# Patient Record
Sex: Female | Born: 1935 | Race: White | Hispanic: No | Smoking: Former smoker
Health system: Southern US, Community
[De-identification: ages and names within clinical notes are randomized; demographics above are authoritative.]

## PROBLEM LIST (undated history)

## (undated) DIAGNOSIS — M810 Age-related osteoporosis without current pathological fracture: Secondary | ICD-10-CM

## (undated) DIAGNOSIS — K219 Gastro-esophageal reflux disease without esophagitis: Secondary | ICD-10-CM

## (undated) DIAGNOSIS — E785 Hyperlipidemia, unspecified: Secondary | ICD-10-CM

## (undated) DIAGNOSIS — K5790 Diverticulosis of intestine, part unspecified, without perforation or abscess without bleeding: Secondary | ICD-10-CM

## (undated) DIAGNOSIS — Z8679 Personal history of other diseases of the circulatory system: Secondary | ICD-10-CM

## (undated) HISTORY — DX: Hyperlipidemia, unspecified: E78.5

## (undated) HISTORY — PX: BREAST BIOPSY: SHX20

## (undated) HISTORY — DX: Personal history of other diseases of the circulatory system: Z86.79

## (undated) HISTORY — DX: Age-related osteoporosis without current pathological fracture: M81.0

## (undated) HISTORY — DX: Gastro-esophageal reflux disease without esophagitis: K21.9

## (undated) HISTORY — DX: Diverticulosis of intestine, part unspecified, without perforation or abscess without bleeding: K57.90

---

## 1998-09-08 ENCOUNTER — Other Ambulatory Visit: Admission: RE | Admit: 1998-09-08 | Discharge: 1998-09-08 | Payer: Self-pay | Admitting: Endocrinology

## 1999-09-28 ENCOUNTER — Other Ambulatory Visit: Admission: RE | Admit: 1999-09-28 | Discharge: 1999-09-28 | Payer: Self-pay | Admitting: Endocrinology

## 2000-10-16 ENCOUNTER — Other Ambulatory Visit: Admission: RE | Admit: 2000-10-16 | Discharge: 2000-10-16 | Payer: Self-pay | Admitting: Endocrinology

## 2001-11-19 ENCOUNTER — Encounter: Admission: RE | Admit: 2001-11-19 | Discharge: 2001-11-19 | Payer: Self-pay | Admitting: Endocrinology

## 2001-11-19 ENCOUNTER — Encounter: Payer: Self-pay | Admitting: Endocrinology

## 2004-04-08 ENCOUNTER — Other Ambulatory Visit: Admission: RE | Admit: 2004-04-08 | Discharge: 2004-04-08 | Payer: Self-pay | Admitting: Obstetrics and Gynecology

## 2004-06-04 HISTORY — PX: TOTAL ABDOMINAL HYSTERECTOMY W/ BILATERAL SALPINGOOPHORECTOMY: SHX83

## 2004-06-15 ENCOUNTER — Inpatient Hospital Stay (HOSPITAL_COMMUNITY): Admission: RE | Admit: 2004-06-15 | Discharge: 2004-06-17 | Payer: Self-pay | Admitting: Obstetrics and Gynecology

## 2004-06-15 ENCOUNTER — Encounter (INDEPENDENT_AMBULATORY_CARE_PROVIDER_SITE_OTHER): Payer: Self-pay | Admitting: *Deleted

## 2004-06-30 ENCOUNTER — Ambulatory Visit: Admission: RE | Admit: 2004-06-30 | Discharge: 2004-06-30 | Payer: Self-pay | Admitting: Gynecology

## 2004-07-06 ENCOUNTER — Ambulatory Visit: Admission: RE | Admit: 2004-07-06 | Discharge: 2004-10-04 | Payer: Self-pay | Admitting: Radiation Oncology

## 2004-07-22 ENCOUNTER — Ambulatory Visit (HOSPITAL_COMMUNITY): Admission: RE | Admit: 2004-07-22 | Discharge: 2004-07-22 | Payer: Self-pay | Admitting: Radiation Oncology

## 2004-09-10 ENCOUNTER — Ambulatory Visit (HOSPITAL_COMMUNITY): Admission: RE | Admit: 2004-09-10 | Discharge: 2004-09-10 | Payer: Self-pay | Admitting: Gynecology

## 2004-09-17 ENCOUNTER — Ambulatory Visit (HOSPITAL_COMMUNITY): Admission: RE | Admit: 2004-09-17 | Discharge: 2004-09-17 | Payer: Self-pay | Admitting: Gynecology

## 2004-09-24 ENCOUNTER — Ambulatory Visit (HOSPITAL_COMMUNITY): Admission: RE | Admit: 2004-09-24 | Discharge: 2004-09-24 | Payer: Self-pay | Admitting: Radiation Oncology

## 2004-10-26 ENCOUNTER — Ambulatory Visit: Admission: RE | Admit: 2004-10-26 | Discharge: 2004-10-26 | Payer: Self-pay | Admitting: Radiation Oncology

## 2004-12-05 DIAGNOSIS — K5790 Diverticulosis of intestine, part unspecified, without perforation or abscess without bleeding: Secondary | ICD-10-CM

## 2004-12-05 HISTORY — DX: Diverticulosis of intestine, part unspecified, without perforation or abscess without bleeding: K57.90

## 2004-12-05 HISTORY — PX: COLONOSCOPY: SHX174

## 2004-12-09 ENCOUNTER — Ambulatory Visit: Payer: Self-pay | Admitting: Internal Medicine

## 2004-12-21 ENCOUNTER — Ambulatory Visit: Admission: RE | Admit: 2004-12-21 | Discharge: 2004-12-21 | Payer: Self-pay | Admitting: Gynecology

## 2004-12-21 ENCOUNTER — Other Ambulatory Visit: Admission: RE | Admit: 2004-12-21 | Discharge: 2004-12-21 | Payer: Self-pay | Admitting: Gynecology

## 2004-12-21 ENCOUNTER — Encounter (INDEPENDENT_AMBULATORY_CARE_PROVIDER_SITE_OTHER): Payer: Self-pay | Admitting: *Deleted

## 2004-12-23 ENCOUNTER — Ambulatory Visit: Payer: Self-pay | Admitting: Internal Medicine

## 2005-02-04 ENCOUNTER — Other Ambulatory Visit: Admission: RE | Admit: 2005-02-04 | Discharge: 2005-02-04 | Payer: Self-pay | Admitting: Gynecology

## 2005-02-04 ENCOUNTER — Encounter (INDEPENDENT_AMBULATORY_CARE_PROVIDER_SITE_OTHER): Payer: Self-pay | Admitting: Specialist

## 2005-02-04 ENCOUNTER — Ambulatory Visit: Admission: RE | Admit: 2005-02-04 | Discharge: 2005-02-04 | Payer: Self-pay | Admitting: Gynecology

## 2005-05-31 ENCOUNTER — Ambulatory Visit: Admission: RE | Admit: 2005-05-31 | Discharge: 2005-05-31 | Payer: Self-pay | Admitting: Radiation Oncology

## 2005-06-22 ENCOUNTER — Ambulatory Visit: Admission: RE | Admit: 2005-06-22 | Discharge: 2005-06-22 | Payer: Self-pay | Admitting: Radiation Oncology

## 2005-06-22 ENCOUNTER — Other Ambulatory Visit: Admission: RE | Admit: 2005-06-22 | Discharge: 2005-06-22 | Payer: Self-pay | Admitting: Radiation Oncology

## 2005-08-02 ENCOUNTER — Other Ambulatory Visit: Admission: RE | Admit: 2005-08-02 | Discharge: 2005-08-02 | Payer: Self-pay | Admitting: *Deleted

## 2005-11-08 ENCOUNTER — Ambulatory Visit: Admission: RE | Admit: 2005-11-08 | Discharge: 2005-11-08 | Payer: Self-pay | Admitting: Gynecologic Oncology

## 2005-11-08 ENCOUNTER — Other Ambulatory Visit: Admission: RE | Admit: 2005-11-08 | Discharge: 2005-11-08 | Payer: Self-pay | Admitting: Gynecologic Oncology

## 2005-11-08 ENCOUNTER — Encounter (INDEPENDENT_AMBULATORY_CARE_PROVIDER_SITE_OTHER): Payer: Self-pay | Admitting: Specialist

## 2005-12-21 IMAGING — RF DG C-ARM 61-120 MIN
1 series · 2 of 2 positions shown · non-contrast
Comparison: none

CLINICAL DATA: Uterine ca.
 TWO VIEWS OF THE PELVIS
 AP and lateral fluoroscopically obtained views of the pelvis show what appears to be a brachytherapy device in position suggesting that it may be within the uterus.  
 IMPRESSION
 Probable brachytherapy placement.

[Series 1: run · 2 of 2 slices shown]
[im 1/2]
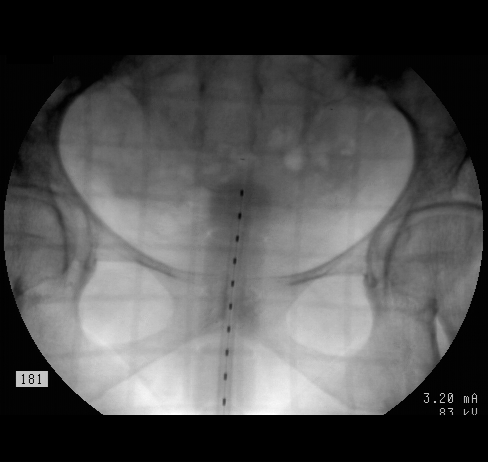
[im 2/2]
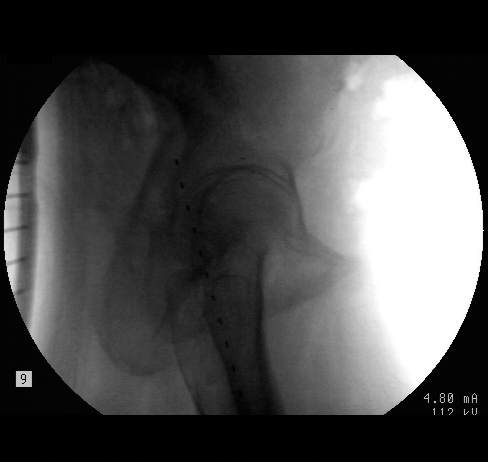

[2 of 2 positions shown; findings below may reference images not displayed]

## 2005-12-28 IMAGING — RF DG PELVIS 1-2V
1 series · 2 of 2 positions shown · non-contrast
Comparison: none

CLINICAL DATA: Cervical cancer/radiation therapy treatment.
 PELVIS 1 VIEW 
 A single AP electronic spot image centered over the symphysis pubis shows an electrode projecting over the midline.  The position is consistent with the vagina and/or cervix.

[Series 1: run · 2 of 2 slices shown]
[im 1/2]
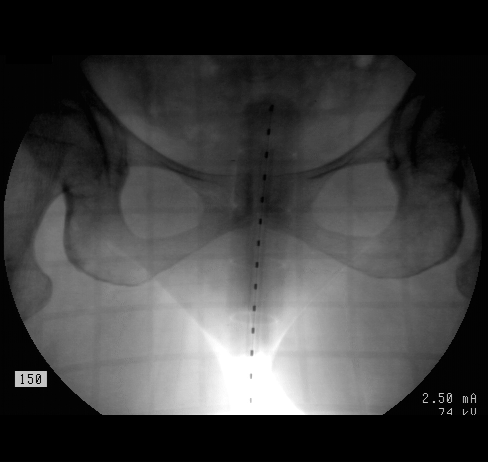
[im 2/2]
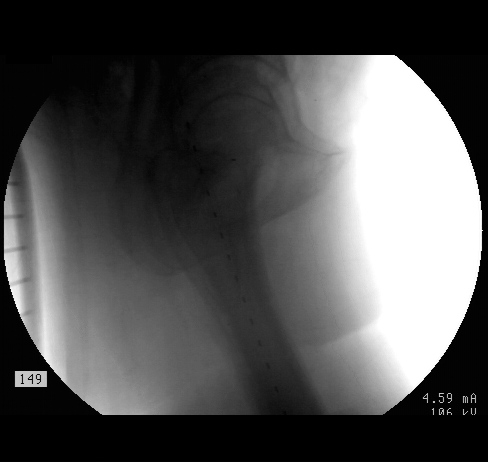

[2 of 2 positions shown; findings below may reference images not displayed]

IMPRESSION: Midline electrode ? see above.

## 2006-01-04 IMAGING — RF DG PELVIS 1-2V
1 series · 1 of 1 positions shown · non-contrast
Comparison: none

CLINICAL DATA: 72-year-old with cervical cancer. 
 ONE TO TWO VIEW PELVIS 
 Comparison to study on 09/16/04.  
 A single lateral view is performed of the pelvis and submitted.  Based on the prior exam, there is a presumed vaginal radiation device with radiopaque markers.  The position of this device does not appear to be changed on the lateral view when compared to the prior study.  Small amount of rectal gas is seen posterior to the markers.  
 IMPRESSION
 Radioactive implant device appears to be unchanged in position compared to the prior study, based on the lateral view only.

[Series 1: run · 1 of 1 slices shown]
[im 1/1]
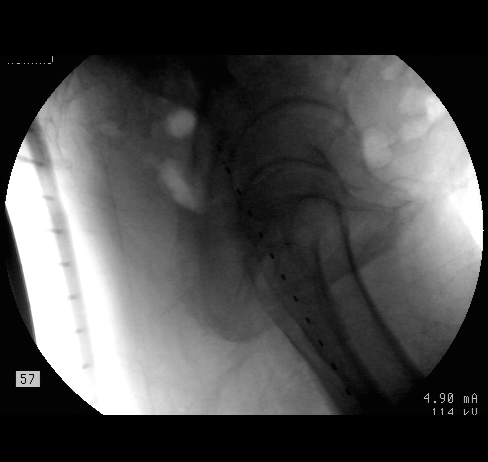

[1 of 1 positions shown; findings below may reference images not displayed]

## 2006-05-16 ENCOUNTER — Other Ambulatory Visit: Admission: RE | Admit: 2006-05-16 | Discharge: 2006-05-16 | Payer: Self-pay | Admitting: Radiation Oncology

## 2006-05-16 ENCOUNTER — Ambulatory Visit: Admission: RE | Admit: 2006-05-16 | Discharge: 2006-06-09 | Payer: Self-pay | Admitting: Radiation Oncology

## 2006-09-29 ENCOUNTER — Encounter: Admission: RE | Admit: 2006-09-29 | Discharge: 2006-09-29 | Payer: Self-pay | Admitting: Obstetrics & Gynecology

## 2006-11-15 ENCOUNTER — Other Ambulatory Visit: Admission: RE | Admit: 2006-11-15 | Discharge: 2006-11-15 | Payer: Self-pay | Admitting: Gynecology

## 2006-11-15 ENCOUNTER — Encounter (INDEPENDENT_AMBULATORY_CARE_PROVIDER_SITE_OTHER): Payer: Self-pay | Admitting: *Deleted

## 2006-11-15 ENCOUNTER — Ambulatory Visit: Admission: RE | Admit: 2006-11-15 | Discharge: 2006-11-15 | Payer: Self-pay | Admitting: Gynecology

## 2007-01-02 ENCOUNTER — Ambulatory Visit: Admission: RE | Admit: 2007-01-02 | Discharge: 2007-01-02 | Payer: Self-pay | Admitting: Gynecologic Oncology

## 2007-03-08 ENCOUNTER — Other Ambulatory Visit: Admission: RE | Admit: 2007-03-08 | Discharge: 2007-03-08 | Payer: Self-pay | Admitting: Obstetrics & Gynecology

## 2007-05-23 ENCOUNTER — Other Ambulatory Visit: Admission: RE | Admit: 2007-05-23 | Discharge: 2007-05-23 | Payer: Self-pay | Admitting: Radiation Oncology

## 2007-08-24 ENCOUNTER — Other Ambulatory Visit: Admission: RE | Admit: 2007-08-24 | Discharge: 2007-08-24 | Payer: Self-pay | Admitting: Gynecology

## 2007-08-24 ENCOUNTER — Ambulatory Visit: Admission: RE | Admit: 2007-08-24 | Discharge: 2007-08-24 | Payer: Self-pay | Admitting: Gynecology

## 2007-08-24 ENCOUNTER — Encounter: Payer: Self-pay | Admitting: Gynecology

## 2007-12-03 ENCOUNTER — Other Ambulatory Visit: Admission: RE | Admit: 2007-12-03 | Discharge: 2007-12-03 | Payer: Self-pay | Admitting: Obstetrics & Gynecology

## 2008-01-09 IMAGING — CT CT ABDOMEN W/ CM
1 of 6 series · 12 of 32 positions shown, 18 images · IV contrast (omnipaque)
Comparison: 07/22/04.

CLINICAL DATA: Endometrial carcinoma.   Lower abdominal and pelvic pain. 
ABDOMEN CT WITH CONTRAST:
TECHNIQUE: Multidetector CT imaging of the abdomen was performed following the standard protocol during bolus administration of intravenous contrast.
Contrast:  100 cc Omnipaque 300 and oral contrast.
TECHNIQUE: Multidetector CT imaging of the pelvis was performed following the standard protocol during bolus administration of intravenous contrast.

[Series 102: routine abdomen · axial · 0.70mm/px · z∈[-331,+2]mm · 12 of 314 slices shown, 18 images]
[im 25/314  soft-tissue]
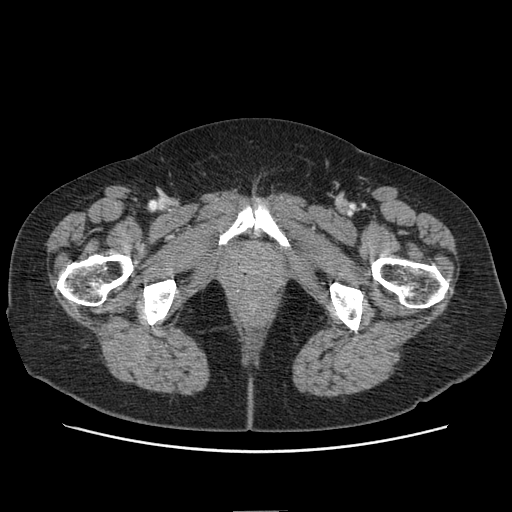
[im 25/314  bone]
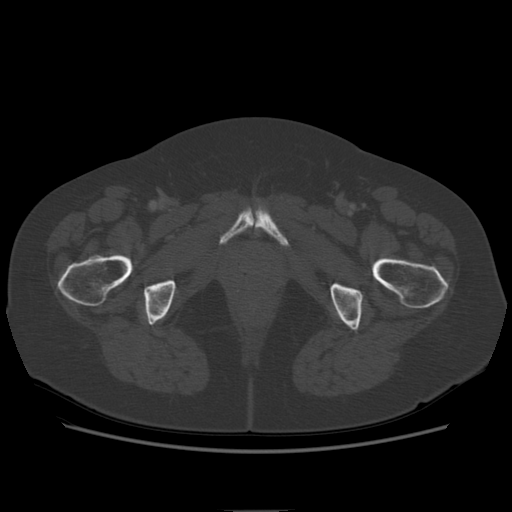
[im 49/314  soft-tissue]
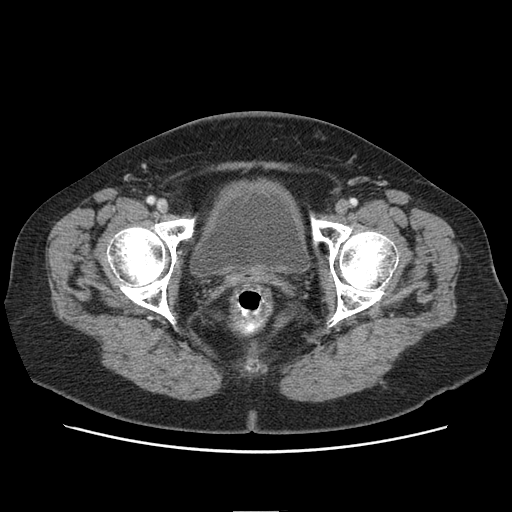
[im 73/314  soft-tissue]
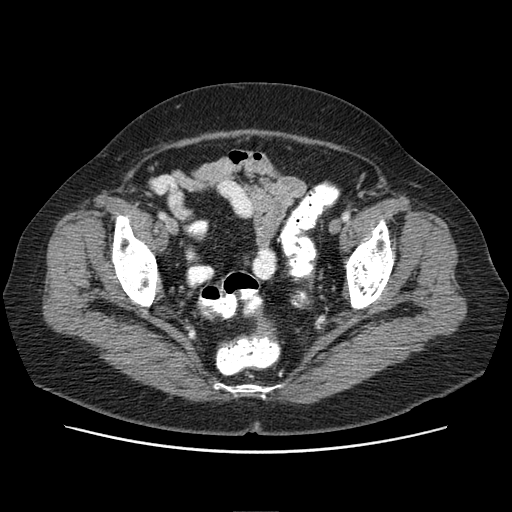
[im 97/314  soft-tissue]
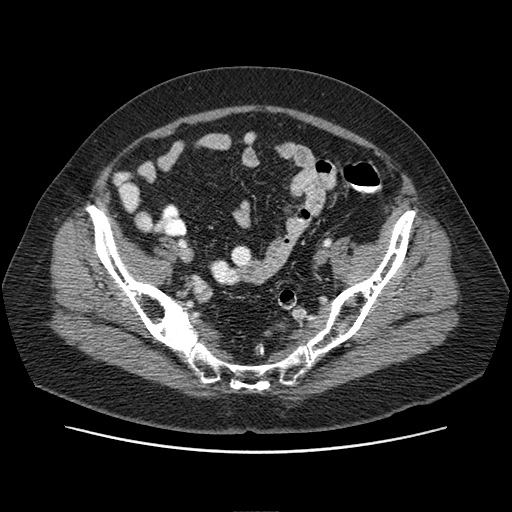
[im 121/314  soft-tissue]
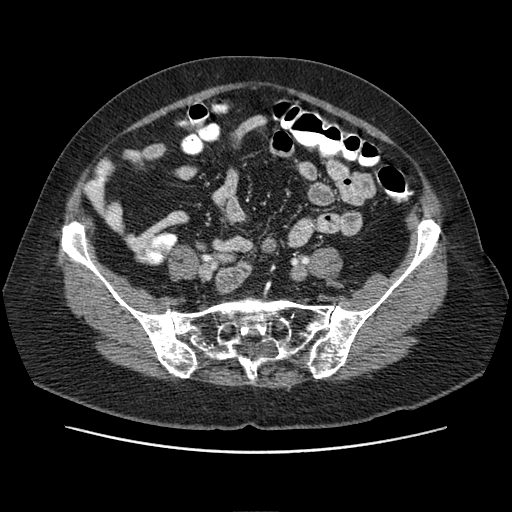
[im 145/314  soft-tissue]
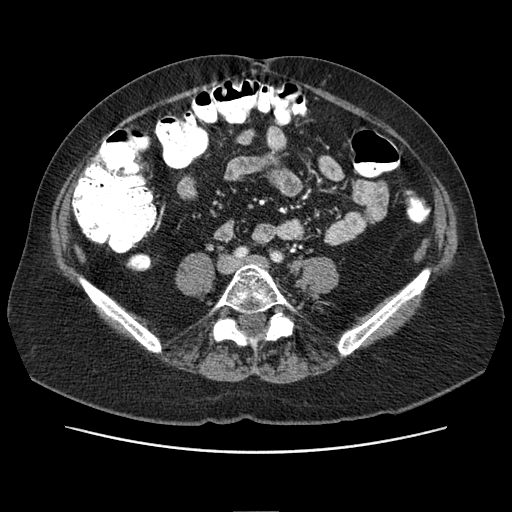
[im 169/314  soft-tissue]
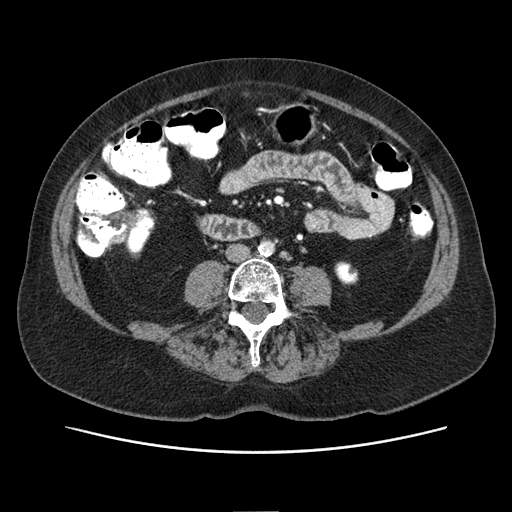
[im 193/314  soft-tissue]
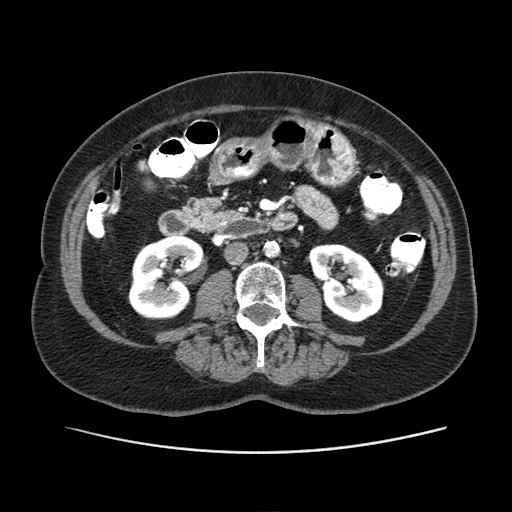
[im 217/314  soft-tissue]
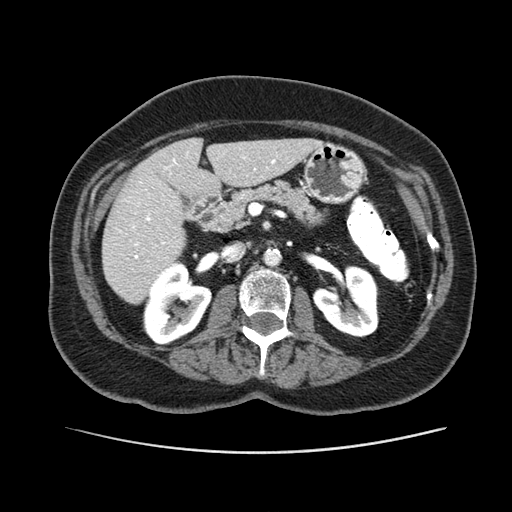
[im 217/314  lung]
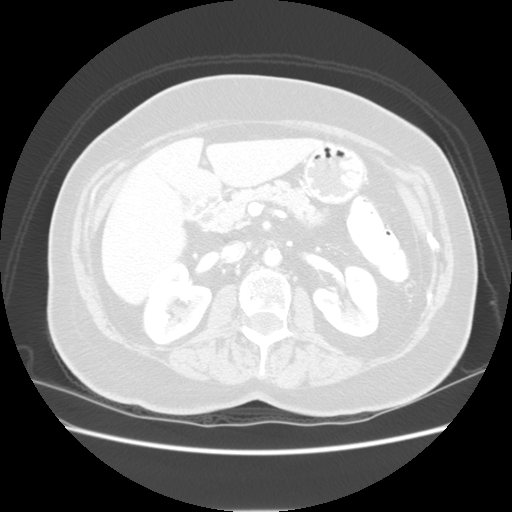
[im 217/314  bone]
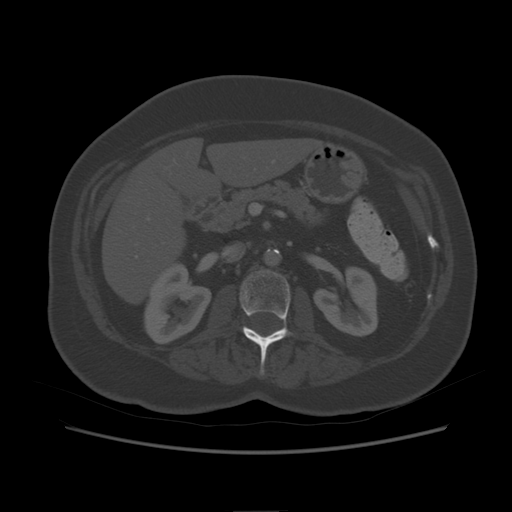
[im 241/314  soft-tissue]
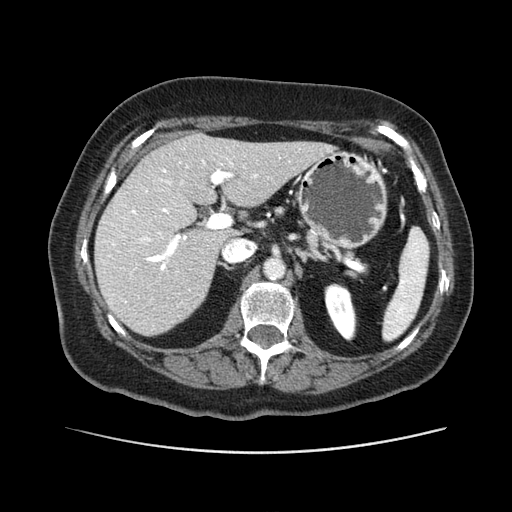
[im 241/314  lung]
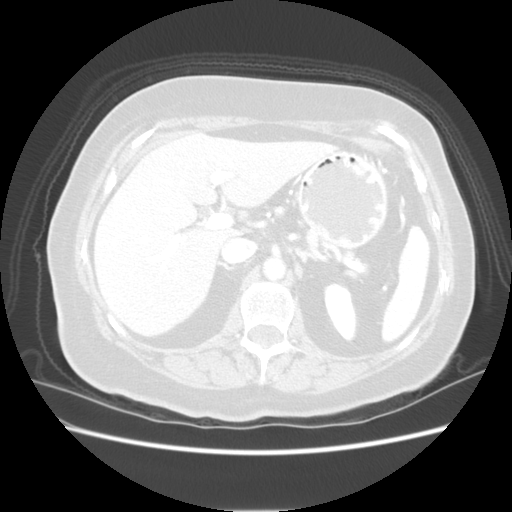
[im 265/314  soft-tissue]
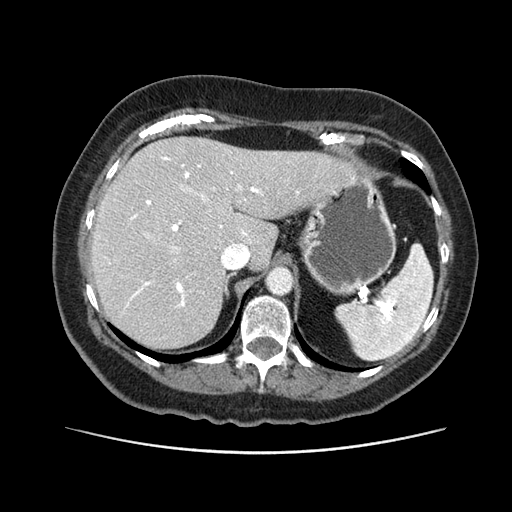
[im 265/314  lung]
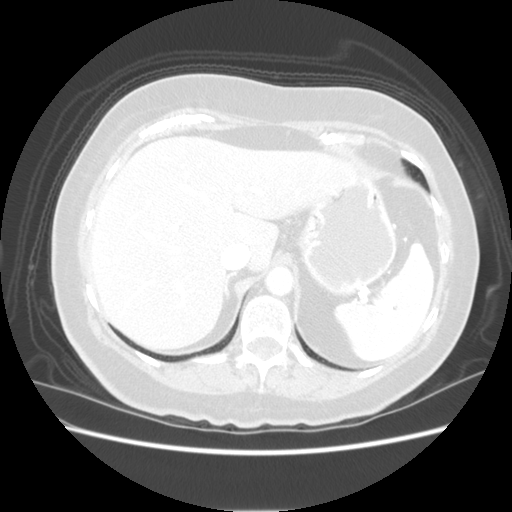
[im 289/314  soft-tissue]
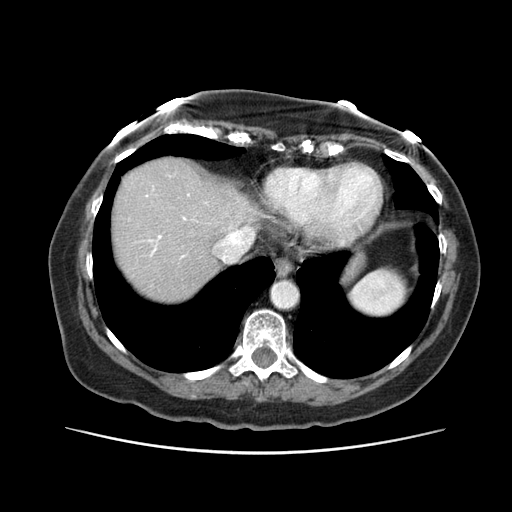
[im 289/314  lung]
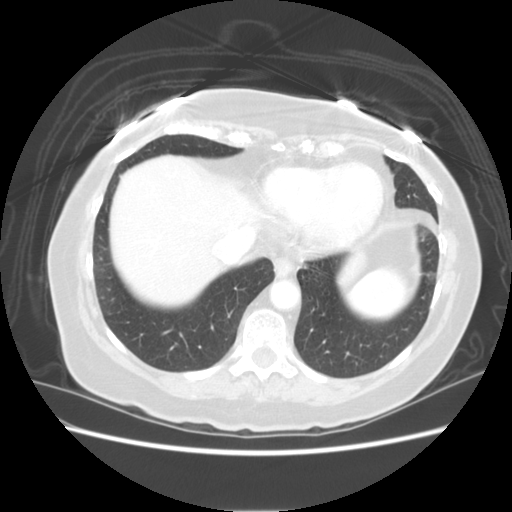

[12 of 32 positions shown; findings below may reference images not displayed]

FINDINGS: A tiny approximately 5 mm cyst in the dome of the liver is stable.  No liver masses are identified.  The gallbladder and appendix are normal in appearance.  The pancreas, spleen, adrenal glands, and kidneys are normal in appearance.  There is no evidence of mass or adenopathy within the abdomen.  There is no evidence of inflammatory process or ascites.  Abdominal bowel loops are unremarkable in appearance.
IMPRESSION: Stable abdomen CT.  No evidence of metastatic disease or other acute findings.
PELVIS CT WITH CONTRAST:
FINDINGS: The patient has undergone previous hysterectomy.  No mass or adenopathy is identified within the pelvis.  There is no evidence of inflammatory process or abnormal fluid collections.  Diverticulosis is again seen involving the sigmoid colon.  However, there is no evidence of diverticulitis.  No significant change is seen compared with the prior study.
IMPRESSION: 1.  No acute findings. 
2.  Sigmoid diverticulosis again noted.

## 2008-03-26 ENCOUNTER — Encounter: Payer: Self-pay | Admitting: Radiation Oncology

## 2008-03-26 ENCOUNTER — Other Ambulatory Visit: Admission: RE | Admit: 2008-03-26 | Discharge: 2008-03-26 | Payer: Self-pay | Admitting: Radiation Oncology

## 2008-03-26 ENCOUNTER — Ambulatory Visit: Admission: RE | Admit: 2008-03-26 | Discharge: 2008-03-26 | Payer: Self-pay | Admitting: Radiation Oncology

## 2008-06-13 ENCOUNTER — Encounter: Payer: Self-pay | Admitting: Gynecology

## 2008-06-13 ENCOUNTER — Other Ambulatory Visit: Admission: RE | Admit: 2008-06-13 | Discharge: 2008-06-13 | Payer: Self-pay | Admitting: Gynecology

## 2008-06-13 ENCOUNTER — Ambulatory Visit: Admission: RE | Admit: 2008-06-13 | Discharge: 2008-06-13 | Payer: Self-pay | Admitting: Gynecology

## 2008-10-09 ENCOUNTER — Other Ambulatory Visit: Admission: RE | Admit: 2008-10-09 | Discharge: 2008-10-09 | Payer: Self-pay | Admitting: Obstetrics & Gynecology

## 2009-03-24 ENCOUNTER — Other Ambulatory Visit: Admission: RE | Admit: 2009-03-24 | Discharge: 2009-03-24 | Payer: Self-pay | Admitting: Radiation Oncology

## 2009-03-24 ENCOUNTER — Encounter: Payer: Self-pay | Admitting: Radiation Oncology

## 2009-03-24 ENCOUNTER — Ambulatory Visit: Admission: RE | Admit: 2009-03-24 | Discharge: 2009-03-24 | Payer: Self-pay | Admitting: Radiation Oncology

## 2009-07-10 ENCOUNTER — Other Ambulatory Visit: Admission: RE | Admit: 2009-07-10 | Discharge: 2009-07-10 | Payer: Self-pay | Admitting: Gynecology

## 2009-07-10 ENCOUNTER — Encounter: Payer: Self-pay | Admitting: Gynecology

## 2009-07-10 ENCOUNTER — Ambulatory Visit: Admission: RE | Admit: 2009-07-10 | Discharge: 2009-07-10 | Payer: Self-pay | Admitting: Gynecology

## 2010-03-23 ENCOUNTER — Ambulatory Visit: Admission: RE | Admit: 2010-03-23 | Discharge: 2010-03-23 | Payer: Self-pay | Admitting: Radiation Oncology

## 2011-04-19 NOTE — Consult Note (Signed)
[name], Shelia NO.:  [id number]   MEDICAL RECORD NO.:  [id number]          PATIENT TYPE:  OUT   LOCATION:  GYN                          FACILITY:  [facility]   PHYSICIAN:  De Blanch, SheliaD.DATE OF BIRTH:  1932/11/03   DATE OF CONSULTATION:  08/24/2007  DATE OF DISCHARGE:                                 CONSULTATION   CHIEF COMPLAINT:  Endometrial cancer.   INTERVAL HISTORY:  Since her last visit, the patient has done well.  She  does have intermittent diarrhea which she is able to control with  Imodium and dietary modification.  Green, leafy vegetables seem to cause  more trouble with diarrhea than any other foods.  Otherwise, she has no  other GI or GU symptoms.  Her functional status is excellent.  She  denies any abdominal pain, pressure, vaginal bleeding, or discharge.   HISTORY OF PRESENT ILLNESS:  The patient underwent initial surgery in  July 2005 for a stage IIB grade 1 endometrial adenocarcinoma.  She  received postoperative pelvic radiation therapy with high dose vaginal  per vault brachytherapy.  She was then followed without any evidence of  disease, although she has had the occasional Pap smear showing atypical  glandular cell.  It was thought that this was possibly associated with  radiation therapy, and she has been using vaginal estrogen cream twice  weekly.   CURRENT MEDICATIONS:  Nexium, Premarin vaginal cream, atenolol, Vytorin,  vitamin D with fish oil.   DRUG ALLERGIES:  None.   PAST MEDICAL HISTORY:  1. Hypertension.  2. Hyperlipidemia.   FAMILY HISTORY:  Negative for gynecologic, breast, or colon cancers.   SOCIAL HISTORY:  The patient does not smoke.   REVIEW OF SYSTEMS:  Ten point comprehensive review of systems negative  except as noted above.   PHYSICAL EXAM:  Weight 139 pounds, blood pressure 128/76.  GENERAL:  The patient is a healthy white female in no acute distress.  HEENT:  Negative.  NECK:  Supple  without thyromegaly.  There is no supraclavicular or  inguinal adenopathy.  ABDOMEN:  Soft, nontender.  No masses, organomegaly, ascites, or hernias  are noted.  PELVIC EXAM:  EG/BUS, vagina, bladder, urethra are normal.  Cervix,  uterus are surgically absent.  No lesions are noted.  Bimanual and  rectovaginal exam reveal no masses, induration, or nodularity.  LOWER EXTREMITIES:  Without edema or varicosities.   IMPRESSION:  Stage IIB grade 1 endometrial carcinoma status post surgery  and pelvic radiation therapy.  No evidence of recurrent disease.   PLAN:  Pap smears are obtained today.  She will return to see Dr.  Leda [name] as previously scheduled in November.  I would suggest  that we rotate visits, and therefore she would see Dr. Roselind Messier in March  and gyn/oncology in July.  We will continue this rotation to complete 5  years of followup.      De Blanch, SheliaD.  Electronically Signed     DC/MEDQ  D:  08/24/2007  T:  08/24/2007  Job:  [id number]   cc:   Shelia Shelia, SheliaD.  Fax: [phone]   M. Leda Quail, MD  Fax: [phone]   Shelia Shelia, SheliaD.  Fax: [phone]   Shelia Bonito. Marina Goodell, MD  [address]  [place]  [place] [address]   Shelia Shelia, R.N.  [address]  [place], [place] [address]

## 2011-04-19 NOTE — Consult Note (Signed)
[name], Shelia NO.:  [id number]   MEDICAL RECORD NO.:  [id number]          PATIENT TYPE:  OUT   LOCATION:  GYN                          FACILITY:  [facility]   PHYSICIAN:  De Blanch, M.D.DATE OF BIRTH:  [birth year]   DATE OF CONSULTATION:  06/13/2008  DATE OF DISCHARGE:                                 CONSULTATION   CHIEF COMPLAINT:  Endometrial cancer.   INTERVAL HISTORY:  Since the patient's last visit with me, she has seen  Dr. Roselind Messier and Dr. Hyacinth Meeker.  Apparently she has done well.  She denies  any GI or GU symptoms, has no pelvic pain, pressure, vaginal bleeding or  discharge.  Functional status is excellent.  She uses Premarin cream  without any difficulty.   HISTORY OF PRESENT ILLNESS:  Patient underwent TAH/BSO, pelvic and  periaortic lymphadenectomy in July of 2005.  She was found to have a  stage II-B, grade 1 endometrial adenocarcinoma.  She received  postoperative pelvic radiation therapy and high dose rate vaginal vault  brachytherapy.  She had been followed since that time with no evidence  of recurrent disease, although she has had occasional Pap smears showing  atypical glandular cells.   CURRENT MEDICATIONS:  Nexium, Premarin vaginal cream, Atenolol, Vytorin,  vitamin D, and fish oil.   DRUG ALLERGIES:  None.   PAST MEDICAL HISTORY:  Hypertension, hyperlipidemia.   FAMILY HISTORY:  Negative for gynecologic, breast or colon cancer.   SOCIAL HISTORY:  The patient is married, does not smoke.   REVIEW OF SYSTEMS:  Ten-point comprehensive review of systems negative,  except as noted above.   PHYSICAL EXAM:  Weight 138 pounds, blood pressure 130/70, pulse 80,  respiratory rate 20.  GENERAL:  Patient is a healthy, white female, in no acute distress.  HEENT:  Negative.  NECK:  Supple without thyromegaly.  There is no supraclavicular or  inguinal adenopathy.  ABDOMEN:  Soft, nontender, no masses, organomegaly, ascites or hernias  noted.  PELVIC EXAM:  EG, BUS, vagina, bladder, urethra are normal.  Cervix and  uterus are surgically absent.  Adnexa without masses.  Rectovaginal exam  confirms.  LOWER EXTREMITIES:  Without edema or varicosities.   IMPRESSION:  Stage II-B, grade 1 endometrial carcinoma, status post  surgical resection and postoperative pelvic radiation therapy.  No  evidence of recurrent disease.   PLAN:  Pap smears were obtained.  The patient was given renewed  prescription for Premarin cream.  She will see Dr. Leanora Ivanoff in  four months, Dr. Roselind Messier four months after that and will see [place] in one  year.      De Blanch, M.D.  Electronically Signed     DC/MEDQ  D:  06/13/2008  T:  06/13/2008  Job:  [id number]   cc:   Billie Lade, M.D.  Fax: [phone]   Corlis Leak Evlyn Kanner, M.D.  Fax: [phone]   Wilhemina Bonito. Marina Goodell, MD  [address]  [place]  [place] [address]

## 2011-04-19 NOTE — Consult Note (Signed)
[name], [name] NO.:  [id number]   MEDICAL RECORD NO.:  [id number]          PATIENT TYPE:  OUT   LOCATION:  GYN                          FACILITY:  [facility]   PHYSICIAN:  De Blanch, M.D.DATE OF BIRTH:  09-08-1932   DATE OF CONSULTATION:  DATE OF DISCHARGE:                                 CONSULTATION   CHIEF COMPLAINT:  Endometrial cancer.   INTERVAL HISTORY:  The patient returns today for continuing follow-up  now 5 years since undergoing surgery for a stage IIB grade 1 endometrial  carcinoma.  Since her last visit she has done well.  She saw Dr.  Rondel Baton nurse practitioner approximately 6 months ago.  At that time  apparently she was told that she would not be able to have intercourse  because of radiation changes.  The patient does use Premarin cream and a  vaginal dilator.  She has no GI or GU symptoms.  Her functional status  is excellent.   HISTORY OF PRESENT ILLNESS:  Stage IIB grade 1 endometrial cancer  undergoing initial surgery July 2005.  She received postoperative whole  pelvis radiation therapy and a high dose rate vaginal vault  brachytherapy.   CURRENT MEDICATIONS:  Nexium, Premarin vaginal cream, atenolol, Vytorin,  vitamin D, and fish oil.   DRUG ALLERGIES:  None.   PAST MEDICAL HISTORY:  1. Hyperlipidemia.  2. Hypertension.   FAMILY HISTORY:  Negative for gynecologic, breast or colon cancer.   SOCIAL HISTORY:  The patient is married.  Does not smoke.   REVIEW OF SYSTEMS:  A 10-point comprehensive review of systems negative  except as noted above.   PHYSICAL EXAMINATION:  Weight 135 pounds, blood pressure 122/60.  GENERAL:  The patient is a healthy white female in no acute distress.  HEENT:  Negative.  NECK:  Supple without thyromegaly.  There is no supraclavicular or  inguinal adenopathy.  ABDOMEN:  Soft, nontender.  No mass, organomegaly, ascites or hernias  noted.  PELVIC:  EGBUS is normal.  The vagina has  normal caliber, but is  foreshortened.  Pap smears were obtained.  Bimanual and rectovaginal  exam revealed no masses, induration or nodularity.   IMPRESSION:  Stage IIB grade 1 endometrial cancer now with 5 years of  follow-up clinically free of disease.   PLAN:  Pap smears are obtained.  The patient will return to the care of  Dr. Hyacinth Meeker.  I would recommend that she have exams on an annual basis.  We would be happy to see her in the future if she had any difficulty.      De Blanch, M.D.  Electronically Signed     DC/MEDQ  D:  07/10/2009  T:  07/10/2009  Job:  [id number]   cc:   Telford Nab, R.N.  [address]  [place], [place] [address]   M. Leda Quail, MD  Fax: [phone]   Tera Mater. Evlyn Kanner, M.D.  Fax: [phone]   Wilhemina Bonito. Marina Goodell, MD  [address]  [place]  [place] [address]

## 2011-04-22 NOTE — Consult Note (Signed)
[name], APT NO.:  [id number]   MEDICAL RECORD NO.:  [id number]          PATIENT TYPE:  OUT   LOCATION:  GYN                          FACILITY:  [facility]   PHYSICIAN:  De Blanch, M.D.DATE OF BIRTH:  [birth year]   DATE OF CONSULTATION:  DATE OF DISCHARGE:                                 CONSULTATION   CHIEF COMPLAINT:  Endometrial cancer.   INTERVAL HISTORY:  Since her last visit, the patient has seen Dr.  Hyacinth Meeker.  Since then, she has had no gynecologic symptoms.  She  specifically denies any GI or GU symptoms, has no pelvic pain, pressure,  vaginal bleeding, or discharge.  She continues to use Premarin vaginal  cream and has not had any vaginal bleeding.   HISTORY OF PRESENT ILLNESS:  Stage IIB, grade I endometrial  adenocarcinoma, undergoing initial surgical staging in July, 2005.  She  received postoperative pelvic radiation therapy and high-dose rate  brachytherapy.  She has been followed since that time with the only  complaint of vaginal atrophy.   PAST MEDICAL HISTORY:  Medical illnesses none.   PAST SURGICAL HISTORY:  TAH/BSO, bilateral tubal ligation, D&C, breast  biopsy.   CURRENT MEDICATIONS:  Niaspan.   OBSTETRICAL HISTORY:  Gravida 3.   FAMILY HISTORY:  Positive for colon cancer in a brother.   REVIEW OF SYSTEMS:  Negative except as noted above.  The patient  recently had a CT scan of the abdomen and pelvis because of lower  abdominal discomfort, which revealed only diverticulosis.   PHYSICAL EXAMINATION:  VITAL SIGNS:  Weight 144 pounds.  Blood pressure  130/76, pulse 80, respiratory rate 20.  GENERAL:  The patient is a healthy white female in no acute distress.  HEENT:  Negative.  NECK:  Supple without thyromegaly.  There is no supraclavicular or  inguinal adenopathy.  ABDOMEN:  Soft and nontender.  No mass, organomegaly, ascites, or  hernias are noted.  PELVIC:  EG/BUS, vagina, bladder, and urethra are normal except  for some  atrophic changes at the apex of the vagina.  Bimanual and rectovaginal  exam reveals no masses, induration, or nodularity.  EXTREMITIES:  Lower extremities are without edema or varicosities.   IMPRESSION:  Stage IIB, grade I endometrial carcinoma, no evidence of  recurrent disease.   PLAN:  Pap smears are obtained.  The patient returned to see Dr. Hyacinth Meeker  in six months.  Return to see [place] in one year.      De Blanch, M.D.  Electronically Signed     DC/MEDQ  D:  11/15/2006  T:  11/15/2006  Job:  [id number]   cc:   M. Leda Quail, MD  Fax: [phone]   Tera Mater. Evlyn Kanner, M.D.  Fax: [phone]   Wilhemina Bonito. Marina Goodell, MD  [address]  [place]  [place] [address]   Billie Lade, M.D.  Fax: [phone]   Telford Nab, R.N.  [address]  [place], [place] [address]

## 2011-04-22 NOTE — Discharge Summary (Signed)
NAME:  [name], [name]                        ACCOUNT NO.:  [id number]   MEDICAL RECORD NO.:  [id number]                   PATIENT TYPE:  INP   LOCATION:  0446                                 FACILITY:  [facility]   PHYSICIAN:  Laqueta Linden, M.D.                 DATE OF BIRTH:  [birth year]   DATE OF ADMISSION:  06/15/2004  DATE OF DISCHARGE:  06/17/2004                                 DISCHARGE SUMMARY   PRINCIPAL DIAGNOSIS ON DISCHARGE:  Grade 1 stage 1 adenocarcinoma of the  endometrium.   SECONDARY DIAGNOSES:  1. IHSS.  2. Hypercholesterolemia.   PROCEDURES:  Total abdominal hysterectomy with bilateral salpingo  oophorectomy, cytologic  washings, intraoperative frozen section.   COMPLICATIONS:  None.   TRANSFUSIONS:  None.   CONSULTATIONS:  None.   HISTORY OF PRESENT ILLNESS:  [name] is a 77 year old menopausal  female who presented with post menopausal bleeding and had an office  endometrial biopsy revealing a grade 1 endometrial adenocarcinoma.  She was  scheduled to undergo TAH BSO with GYN-ONC standby by Dr. Katheren Shams-  Sharol Given at [facility]  Please see written history and physical  for full details of her admission, past history, social history, family  history and examination.   HOSPITAL COURSE:  The patient was admitted for same-day surgery on June 15, 2004, having undergone a mechanical bowel prep at home.  Due to her history  of IHSS she was treated with ampicillin and gentamycin perioperatively.  She  was cleared for surgery perioperatively by Dr. [place] who felt like her IHSS  was stable on her beta blocker.   Intraoperatively, frozen section revealed less than 1 mm invasion of a well  differentiated adenocarcinoma with a 2 cm thick uterine wall.  This was  discussed briefly by telephone by Dr. Stanford Breed who was on standby who  concurred that further staging with lymphadenectomy was not indicated.  The  procedure took place without  complications.   Postoperatively, the patient did well.  On postoperative day 1 she had a  minimal amount of pain, was voiding and passing flatus.  She retained stable  vital signs with a regular pulse of 72.  Urine output was excellent.  She  began ambulating and advanced her diet on postoperative day 1.  Postoperative hemoglobin was 12.2, hematocrit 34.8, down from 13.1 and 38.5  preoperatively.  Her BMET was normal, with a slightly decreased sodium of  131.   On postoperative day 2, she was tolerating a general diet, passing flatus  and had a small stool and stated that she was ready to go home.  Her  incision was healing quite well and the staples were removed and steri-  strips were placed prior to discharge.  The final pathology was pending at  the time of discharge.   The patient was given routine written and verbal discharge instructions.  She was given  a prescription for Percocet 5/325, dispense 20 with no refills  to take 1-2 4-6 hours p.r.n. pain.  She was also to take her Advil or Aleve  as needed and all her routine medications.  She is to follow up in the  office in 2 weeks for an incision check and was told she was to receive a  phone call with her final pathology report and any additional  recommendations.  She was discharged home in improved condition.   LATE ADDENDUM:  Final pathology returned confirming a well-differentiated  endometrioid adenocarcinoma with less than 1/2 thickness myometrial  invasion.  However, the carcinoma did extend to involve endocervical glands  with cervical stromal invasion.  Vascular lymphatic invasion was not  identified and there was no other carcinoma identified on either tube or  ovaries.  Cytologic washings were negative.  Notably, this changes to Stage  IIB due to the cervical stromal invasion.  Consultation with consideration  of additional treatment has been arranged with Dr. Stanford Breed  postoperatively after discharge.                                                Laqueta Linden, M.D.    LKS/MEDQ  D:  07/30/2004  T:  07/30/2004  Job:  [id number]   cc:   Jeannett Senior A. Evlyn Kanner, M.D.  [address]  [place]  [place] [address]  Fax: [phone]   Peter M. Swaziland, M.D.  [address]  [place], [place] [address]  Fax: [phone]   Telford Nab, R.N.  [phone] [address]  [place], [place] [address]

## 2011-04-22 NOTE — Op Note (Signed)
NAME:  [name], [name]                        ACCOUNT NO.:  [id number]   MEDICAL RECORD NO.:  [id number]                   PATIENT TYPE:  INP   LOCATION:  0007                                 FACILITY:  [facility]   PHYSICIAN:  Laqueta Linden, M.D.                 DATE OF BIRTH:  01-Nov-1932   DATE OF PROCEDURE:  06/15/2004  DATE OF DISCHARGE:                                 OPERATIVE REPORT   PREOPERATIVE DIAGNOSIS:  Grade 1 adenocarcinoma of the endometrium.   POSTOPERATIVE DIAGNOSES:  Grade 1 adenocarcinoma of the endometrium, frozen  section with less than 1 mm of invasion of a well-differentiated tumor with  a total wall thickness of 2 cm per Dr. Quentin Ore, pathology, frozen section.   PROCEDURE:  Total abdominal hysterectomy, bilateral salpingo-oophorectomy,  cytologic washings.   SURGEON:  Laqueta Linden, M.D.   ASSISTANT:  Andres Ege, M.D.   ANESTHESIA:  General endotracheal.   ESTIMATED BLOOD LOSS:  Less than 100 mL.   URINE OUTPUT:  300 mL.   FLUIDS:  1700 mL of crystalloid (since 6 hours preoperatively).   SPECIMENS:  Uterus, tubes, ovaries and cytologic washings.   COUNTS:  Correct x2.   COMPLICATIONS:  None.   INDICATIONS:  [name] is a 77 year old menopausal female with  postmenopausal bleeding who underwent office endometrial biopsy revealing  grade 1 endometrioid adenocarcinoma.  She is to undergo TAH/BSO with  oncologic staging as necessary.  Dr. De Blanch has been  consulted and will be on surgical standby in the event that lymphadenectomy  and additional staging procedures are necessary.  The patient and her  husband have seen the informed consent film, voice understanding and  acceptance of all risks, benefits, alternatives and complications including,  but not limited to, anesthesia risk, infection, bleeding, possibly requiring  transfusion, injury to bowel, bladder, ureters, vessels, nerves, possibility  of fistula formation,  other postoperative risks including DVT, PE,  pneumonia, death as well as the potential increased risk of bleeding and  postoperative complications if the additional staging procedure is necessary  as well as the possible need for adjuvant therapy postoperatively.  She has  voiced her understanding and acceptance of all risks and agrees to proceed.   PROCEDURE:  The patient was taken to the operating room after having  undergone a mechanical bowel prep 24 hours prior to surgery and having been  on a clear liquid diet in the event that surgical staging was necessary.  She was admitted six hours preoperatively to start IV fluids so that she did  not enter the operating room dehydrated.  She was placed on the operating  table and after proper identification and consents were ascertained she was  then placed in the Anthony stirrups.  The abdomen, perineum and vagina were  then prepped and draped in a routine sterile fashion.  A transurethral Foley  was placed.  A low transverse  incision was made and carried down to the  level of the anterior rectus fascia.  The fascia was incised, rectus muscles  were separated, parietal peritoneum was incised.  The incision was extended  through the parietal peritoneum.  Cytologic washings were taken and sent in  heparinized solution to pathology.  Palpation of the upper abdomen revealed  smooth renal contours bilaterally, smooth peritoneal surfaces, smooth liver  edge and spleen tip.  No palpable gallstones.  The appendix was noted to be  quite long but within normal limits.  A self-retaining retractor was placed  and the bowel packed in the upper abdomen using moistened lap pads.  The  uterus was noted to be tiny.  Both tubes and ovaries were normal and  atrophic in appearance, both ureters were of normal course and caliber deep  in the pelvis and well out of the operative field.  The uterus was elevated  in the operative field.  The round ligaments were  clamped, cut, suture  ligated and tagged with 0 Vicryl suture.  The broad ligaments were then  opened anteriorly and posteriorly with dissection of the bladder off the  lower uterine segment and cervix.  The infundibulopelvic ligaments were  isolated bilaterally, clamped, cut and doubly ligated with a free tie and a  stitch of 0 Vicryl.  Uterine vessels were then skeletonized and clamped and  pedicles cut, suture ligated.  Curved Heaney clamps were then placed across  the cardinal ligaments bilaterally with the initial pedicle incorporating  the previous one, such that the uterine vessels were doubly ligated.  Curved  Heaney clamps were placed across the uterosacral ligaments and upper vaginal  angles bilaterally with excision of the specimen which was sent for frozen  section.  The upper vaginal angles were then closed with figure-of-eight  sutures of 0 Vicryl.  The remainder of the vaginal cuff was closed from  front to back using figure-of-eight sutures of 0 Vicryl as well.  Hemostasis  was noted to be excellent.  All pedicles were hemostatic, there was no  bleeding from any peritoneal surface.  The ureters were intact.  While  awaiting the frozen section, as it was felt very likely that further  surgical staging would not be necessary, closure of the incision was begun.  Copious lavage was performed.  The wet packs and retractor were then  removed.  All counts were correct prior to closure of the peritoneum.  The  peritoneum was then closed with 2-0 Vicryl, the rectus muscles loosely  reapproximated in midline.  Subfascial hemostasis was ascertained and the  fascia was closed from both lateral aspects to the midline using a running  stitch of 0 Maxon.  Subcutaneous hemostasis was obtained.  The skin was  closed with staples and Steri-Strips and a pressure dressing were applied.  The patient was kept asleep until the frozen section report was received, as noted above.  At this point, the  patient was awakened, extubated and stable  on transfer to the PACU.  Dr. Stanford Breed was immediately notified that  further surgical staging was not indicated and was taken off standby at 1515  hours.                                               Laqueta Linden, M.D.    LKS/MEDQ  D:  06/15/2004  T:  06/15/2004  Job:  [id number]   cc:   Tera Mater. Evlyn Kanner, M.D.  [address]  [place]  [place] [address]  Fax: [phone]   Peter M. Swaziland, M.D.  [address]  [place], [place] [address]  Fax: [phone]

## 2011-04-22 NOTE — Consult Note (Signed)
NAMEMERARI, PION NO.:  [id number]   MEDICAL RECORD NO.:  [id number]          PATIENT TYPE:  OUT   LOCATION:  GYN                          FACILITY:  [facility]   PHYSICIAN:  John T. Kyla Balzarine, M.D.    DATE OF BIRTH:  [birth year]   DATE OF CONSULTATION:  DATE OF DISCHARGE:                                 CONSULTATION   CHIEF COMPLAINT:  Followup of AGUS cytology in the setting of prior  stage 2B endometrial adenocarcinoma treated with surgery and  radiotherapy.   HISTORY OF PRESENT ILLNESS:  This patient had stage 2B, grade 1  endometrial adenocarcinoma treated with surgical staging in July 2005  followed by postoperative pelvic radiation and high-dose brachytherapy.  Last Pap smear was AGUS and it was thought that this might be  reactive/metaplastic.  She was seen by Dr. Leda Quail on January 7  and colposcopy revealed diminished Lugol's staining and a small area in  the right side wall with increased vascularity.  Biopsies from both  locations were obtained and returned negative for atypical glandular  cells, dysplasia, or malignancy.  There was slight inflammation and  fibrosis under the right cuff biopsy.  The patient is essentially  asymptomatic in regards to the pelvis, with no bleeding.  She is using  vaginal estrogen for a longstanding atrophy.  She is otherwise healthy  with no major comorbidities.  In addition to vaginal estrogen is using  Niaspan.   EXAM:  VITAL SIGNS:  Stable and afebrile as noted on the clinic flow  sheet.  The patient has a benign abdomen with no ascites, mass, or tenderness.  No hernias at the surgical sites.  There is no pedal edema, cords, or Homan's.  Lymph node survey is negative for pathological lymphadenopathy.  PELVIC:  External genitalia and BUS normal.  The vagina is extremely  atrophic with healing biopsy sites and no mucosal lesions.  Bimanual and  rectovaginal examinations reveal absent uterus and cervix with no  tenderness, mass, or nodularity.   ASSESSMENT:  1. Endometrial carcinoma, no evidence of disease.  2. Marked vaginal atrophy and response to pelvic radiation and age may      be the root cause of her atypical glandular cells.   PLAN:  The patient has previously been discussed with Dr. Hyacinth Meeker, and I  agree with her recommendation for followup cytology in 3 months.  The  patient was counseled to continue using her vaginal estrogen, and we  will see her 6 months after her followup with Dr. Hyacinth Meeker.      John T. Kyla Balzarine, M.D.  Electronically Signed     JTS/MEDQ  D:  01/02/2007  T:  01/02/2007  Job:  [id number]   cc:   M. Leda Quail, MD  Fax: [phone]   Tera Mater. Evlyn Kanner, M.D.  Fax: [phone]   Wilhemina Bonito. Marina Goodell, MD  [address]  [place]  [place] [address]   Billie Lade, M.D.  Fax: [phone]   Telford Nab, R.N.  [address]  [place], [place] [address]

## 2011-04-22 NOTE — Consult Note (Signed)
NAME:  [name], [name]                        ACCOUNT NO.:  [id number]   MEDICAL RECORD NO.:  [id number]                   PATIENT TYPE:  OUT   LOCATION:  GYN                                  FACILITY:  [facility]   PHYSICIAN:  De Blanch, M.D.         DATE OF BIRTH:  05-03-1932   DATE OF CONSULTATION:  06/30/2004  DATE OF DISCHARGE:                                   CONSULTATION   REASON FOR CONSULTATION:  A 77 year old white female seen in consultation at  the request of Dr. Laqueta Linden regarding management of endometrial cancer.   The patient was found to have a grade I endometrial cancer after being  evaluated for abnormal uterine bleeding.  She underwent total abdominal  hysterectomy and bilateral salpingo-oophorectomy on June 15, 2004.  Intraoperative frozen section was obtained revealing minimal myometrial  invasion.  The patient had an uncomplicated postoperative course.   Subsequently, the pathology has returned showing that in fact the  endometrial tumor is only invading 2 mm,  however, there is involvement of  the cervical stroma (stage IIB).  Perineal washings and adnexa were  negative.   PAST MEDICAL HISTORY:  None.   PAST SURGICAL HISTORY:  1. Total abdominal hysterectomy.  2. Bilateral salpingo-oophorectomy.  3. Bilateral tubal ligation.  4. D&C.  5. Breast biopsy.   OBSTETRICAL HISTORY:  Gravida 3.   CURRENT MEDICATIONS:  1. Atenolol.  2. Nexium.  3. Calcium.  4. Vitamin E.  5. Baby aspirin.   FAMILY HISTORY:  Positive for colon cancer in a brother.  There is no  ovarian, gynecologic, or breast cancer.   PHYSICAL EXAMINATION:  Weight 133 pounds, height 5 feet 2 inches, blood  pressure 140/82.  The remainder of the physical examination is deferred  because of the patient's recent surgery.   IMPRESSION:  Stage IIB grade 1 endometrial adenocarcinoma.   RECOMMENDATIONS:  I indicated to the patient that her risk of lymph node  metastases,  especially because of cervical stromal invasion, was elevated  and I would therefore recommend that she receive postoperative whole pelvis  radiation therapy.  The rationale for this approach was discussed at length  with the patient, her daughter, and husband.  They understand the value of  radiation therapy and potential side effects and complications.  We will  arrange for her to see Dr. Antony Blackbird on July 07, 2004, and proceed  thereafter with adjuvant treatment.  She will then return to the care of Dr.  Myrlene Broker, and we would recommend that she have exams every three months in  the first year, every four months in the second year, and every six months  to complete five years of followup.  De Blanch, M.D.    DC/MEDQ  D:  06/30/2004  T:  06/30/2004  Job:  [id number]   cc:   Billie Lade, M.D.  [address] - [facility]  [place]  [place] [address]  Fax: [phone]   Laqueta Linden, M.D.  [address]  [place]  [place] [address]  Fax: [phone]   Telford Nab, R.N.  [phone] [address]  [place], [place] [address]

## 2011-04-22 NOTE — Consult Note (Signed)
NAMEKHAILA, VELARDE NO.:  [id number]   MEDICAL RECORD NO.:  [id number]          PATIENT TYPE:  OUT   LOCATION:  GYN                          FACILITY:  [facility]   PHYSICIAN:  De Blanch, M.D.DATE OF BIRTH:  [birth year]   DATE OF CONSULTATION:  12/21/2004  DATE OF DISCHARGE:                                   CONSULTATION   A 77 year old white female returns for continuing follow-up of a stage IIB  endometrial cancer.  The patient underwent initial TAH/BSO, July 2005.  Final pathology showed there was involvement of the cervical stroma (stage  IIB).  She received postoperative pelvic radiation therapy and has done  well.  Currently, her only complaint is that she has some loose stools, but  she does not really have chronic diarrhea.  She has not altered her diet at  all.  Otherwise, she has no pelvic pain, pressure, vaginal pain or  discharge.  Her functional status is excellent.   PAST MEDICAL HISTORY:   MEDICAL ILLNESSES:  None.   PAST SURGICAL HISTORY:  1.  Total abdominal hysterectomy, bilateral salpingo-oophorectomy.  2.  Bilateral tubal ligation.  3.  D&C.  4.  Breast biopsy.   OBSTETRICAL HISTORY:  Gravida 3.   CURRENT MEDICATIONS:  Atenolol, Nexium, calcium, vitamin E, baby aspirin,  and Vytorin.   FAMILY HISTORY:  Positive for colon cancer in a brother.  There is no  ovarian, gynecologic, or breast cancer in the family history.   REVIEW OF SYSTEMS:  Negative except as noted above.   PHYSICAL EXAMINATION:  VITAL SIGNS:  Weight 131 pounds, blood pressure  128/80.  GENERAL:  The patient is a healthy white female in no acute distress.  HEENT:  Negative.  NECK:  Supple without thyromegaly.  There is no supraclavicular or inguinal  adenopathy.  ABDOMEN:  Soft, nontender.  No masses, organomegaly, ascites, or hernias are  noted.  Her incision is well-healed.  PELVIC:  EG/BUS, vagina, bladder, urethra are normal.  Vaginal cuff is well-  supported and well-healed, and no lesions are noted.  Bimanual and  rectovaginal exam reveal no masses, induration, or nodularity.   IMPRESSION:  Stage IIB grade 1 endometrial adenocarcinoma, status post  TAH/BSO and postoperative whole pelvis radiation therapy.  No evidence of  recurrent disease.   PLAN:  Pap smears are obtained.  The patient will return to see Dr. Larita Fife  Smith's associate in March.  Return to see Dr. Roselind Messier in June and return to  see me in September 2006.      DC/MEDQ  D:  12/21/2004  T:  12/21/2004  Job:  [id number]   cc:   Billie Lade, M.D.  [address] - [facility]  [place]  [place] [address]  Fax: [phone]   Laqueta Linden, M.D.  [address]  [place]  [place] [address]  Fax: [phone]   Telford Nab, R.N.  [phone] [address]  [place], [place] [address]

## 2011-04-22 NOTE — Consult Note (Signed)
[name], [name] NO.:  [id number]   MEDICAL RECORD NO.:  [id number]          PATIENT TYPE:  OUT   LOCATION:  GYN                          FACILITY:  [facility]   PHYSICIAN:  De Blanch, M.D.DATE OF BIRTH:  1932/08/18   DATE OF CONSULTATION:  DATE OF DISCHARGE:                                   CONSULTATION   A 77 year old white female returns for continuing followup of a stage IIB  endometrial carcinoma (grade I), status post initial surgery in July, 2005.  Because of cervical stromal involvement, she received postoperative pelvic  radiation therapy and three high-dose brachii therapy vaginal vault  applications.  The patient called earlier today, indicating that she had new  onset of pink vaginal discharge.  She denies any abdominal pain or pressure,  any other GI or GU symptoms.   MEDICAL ILLNESSES:  None.   PAST SURGICAL HISTORY:  TAH/BSO, bilateral tubal ligation, D&C, breast  biopsy.   CURRENT MEDICATIONS:  Niaspan.   OBSTETRICAL HISTORY:  Gravida 3.   FAMILY HISTORY:  Positive for colon cancer in a brother.   REVIEW OF SYSTEMS:  Negative except as noted above.   PHYSICAL EXAMINATION:  VITAL SIGNS:  Weight 137 pounds.  Blood pressure  160/84.  GENERAL:  The patient is a healthy white female in no acute distress.  HEENT:  Negative.  NECK:  Supple without thyromegaly.  There is no supraclavicular or inguinal  adenopathy.  ABDOMEN:  Soft and nontender.  No mass, organomegaly, ascites, or hernias  are noted.  PELVIC:  EG/BUS, vagina, bladder, and urethra are normal.  The vaginal is  atrophic, and there is a slight area of erythema and minimal spotting at the  vaginal apex.  This is consistent with atrophic vaginitis and possible  radiation effect.  Bimanual and rectovaginal exam reveals no mass,  induration, or nodularity.   IMPRESSION:  1.  Stage IIB grade I endometrial carcinoma, status post surgery plus      radiation therapy.  No  evidence for recurrent disease.  2.  Vaginal bleeding most likely secondary to vaginal atrophy and irritation      of the apex of the vagina from radiation therapy.   I discussed the natural history of this problem with the patient, and I have  given her a prescription for Premarin 0.5 gm vaginally on Monday, Wednesday,  and Friday at bedtime.  Pap smears are also obtained.  She is to return in  three months for followup unless there is a problem with her Pap smear.      DC/MEDQ  D:  02/04/2005  T:  02/04/2005  Job:  [id number]   cc:   Billie Lade, M.D.  [address] - [facility]  [place]  [place] [address]  Fax: [phone]   Laqueta Linden, M.D.  [address]  [place]  [place] [address]  Fax: [phone]   Telford Nab, R.N.  [phone] [address]  [place], [place] [address]

## 2011-04-22 NOTE — Consult Note (Signed)
NAMEADAM, DEMARY NO.:  [id number]   MEDICAL RECORD NO.:  [id number]          PATIENT TYPE:  OUT   LOCATION:  GYN                          FACILITY:  [facility]   PHYSICIAN:  John T. Kyla Balzarine, M.D.    DATE OF BIRTH:  May 27, 1932   DATE OF CONSULTATION:  11/08/2005  DATE OF DISCHARGE:                                   CONSULTATION   CLINIC NOTE   CHIEF COMPLAINT:  Followup of endometrial cancer.   INTERVAL HISTORY:  The patient underwent surgical staging of a stage IIB,  grade 1 endometrial carcinoma in July 2005.  She had cervical stromal  involvement and received postoperative pelvic radiotherapy with 3 high-dose  vault treatments.  She has had problems with relative upper vaginal  contraction and atrophy, treated with vaginal Premarin.  Since the summer,  she has had very few episodes of spotting and denies pain, back pain or leg  swelling.   PAST MEDICAL HISTORY:  Significant for no major comorbidities other than  endometrial cancer as above.  She is status post TAH/BSO, bilateral tubal  ligation, D&C, breast biopsy and NSVD x three.   MEDICATIONS:  Niaspan.   ALLERGIES:  None known.   FAMILY HISTORY:  Significant for brother with colon cancer.   PERSONAL AND SOCIAL HISTORY:  Tobacco, rare.  Social ethanol.   REVIEW OF SYSTEMS:  The patient has elevated cholesterol.    Weight 143 pounds, blood pressure 128/78.  Vital signs are stable.  MENTAL STATUS:  The patient is anxious, alert and oriented x3 in no acute  distress.  LYMPHATIC:  There is no pathologic lymphadenopathy on lymph node survey.  BACK:  Nontender, and there is no CVA tenderness.  ABDOMEN:  Soft and benign with no hernia, ascites, mass, tenderness or  organomegaly.  EXTREMITIES:  Excellent strength and range of motion without edema.  PELVIC:  External genitalia and BUS are normal.  The bladder and urethra are  normal. Vagina has a circular constriction approximately two-thirds of the  way  up the vagina towards the apex, and vaginal mucosa is more atrophic at  the cuff.  There is a small patch of telangiectasia.  There are no mucosal  lesions that are visible or palpable.  Bimanual and rectovaginal  examinations disclose absent uterus and cervix with no adnexal or  parametrial mass.   ASSESSMENT:  1.  Endometrial carcinoma, no active disease.  2.  Radiation atrophy.   The patient will continue her vaginal estrogen and alternate followup  between myself and Dr. Roselind Messier.      John T. Kyla Balzarine, M.D.  Electronically Signed     JTS/MEDQ  D:  11/08/2005  T:  11/09/2005  Job:  [id number]   cc:   Telford Nab, R.N.  [address]  [place], [place] [address]   Tera Mater. Evlyn Kanner, M.D.  Fax: [phone]   Laqueta Linden, M.D.  Fax: [phone]   Wilhemina Bonito. Marina Goodell, M.D. [facility]  [address]  [place]  [place] [address]   Billie Lade, M.D.  Fax: [phone]

## 2014-05-14 ENCOUNTER — Ambulatory Visit: Payer: Self-pay | Admitting: Obstetrics & Gynecology

## 2014-05-15 ENCOUNTER — Encounter: Payer: Self-pay | Admitting: Obstetrics & Gynecology

## 2014-05-16 ENCOUNTER — Ambulatory Visit (INDEPENDENT_AMBULATORY_CARE_PROVIDER_SITE_OTHER): Payer: Medicare Other | Admitting: Obstetrics & Gynecology

## 2014-05-16 ENCOUNTER — Encounter: Payer: Self-pay | Admitting: Obstetrics & Gynecology

## 2014-05-16 VITALS — BP 138/78 | HR 60 | Resp 20 | Ht 62.25 in | Wt 137.0 lb

## 2014-05-16 DIAGNOSIS — Z01419 Encounter for gynecological examination (general) (routine) without abnormal findings: Secondary | ICD-10-CM

## 2014-05-16 DIAGNOSIS — Z124 Encounter for screening for malignant neoplasm of cervix: Secondary | ICD-10-CM

## 2014-05-16 DIAGNOSIS — Z8542 Personal history of malignant neoplasm of other parts of uterus: Secondary | ICD-10-CM

## 2014-05-16 NOTE — Progress Notes (Signed)
80 y.o. G4P3 MarriedCaucasianF here for annual exam.  Doing well.  Still has lake house she is trying to sell.  No vaginal bleeding.  PCP:  Dr. Forde Dandy.  Last visit was March.  Goes every six months.    Patient's last menstrual period was 06/04/2004.          Sexually active: no  The current method of family planning is status post hysterectomy.    Exercising: no  not regularly Smoker:  Former smoker in her early 65s  Health Maintenance: Pap:  02/14/13 WNL History of abnormal Pap:  Yes h/o endometrial cancer MMG:  02/12/14 3D-normal Colonoscopy:  2006-repeat in 10 years BMD:   2013 TDaP:  2013 Screening Labs: Dr Forde Dandy, Hb today: Dr Forde Dandy, Urine today: Dr Forde Dandy   reports that she has quit smoking. She has never used smokeless tobacco. She reports that she drinks alcohol. She reports that she does not use illicit drugs.  Past Medical History  Diagnosis Date  . H/O idiopathic hypertrophic subaortic stenosis   . GERD (gastroesophageal reflux disease)   . Abnormal uterine bleeding   . Osteoporosis   . Diverticulosis 12/2004  . S/P TAH-BSO (total abdominal hysterectomy and bilateral salpingo-oophorectomy) 06/2004  . Adenocarcinoma of endometrium 06/2004    IIB Gr1  . Hyperlipemia     Past Surgical History  Procedure Laterality Date  . Colonoscopy  12/2004  . Breast biopsy      benign tumor removed  . Total abdominal hysterectomy w/ bilateral salpingoophorectomy  7/05    II B, Gr I, adenoca endometrium    Current Outpatient Prescriptions  Medication Sig Dispense Refill  . aspirin 81 MG tablet Take 81 mg by mouth daily.      Marland Kitchen atenolol (TENORMIN) 50 MG tablet Take 50 mg by mouth 2 (two) times daily.      . Calcium Carbonate-Vit D-Min (CALCIUM 1200 PO) Take by mouth daily.      Marland Kitchen esomeprazole (NEXIUM) 40 MG capsule Take 40 mg by mouth daily at 12 noon.      . niacin 500 MG tablet Take 500 mg by mouth at bedtime.      . Omega-3 Fatty Acids (OMEGA-3 FISH OIL PO) Take by mouth daily.       . simvastatin (ZOCOR) 80 MG tablet Take 80 mg by mouth daily.      Marland Kitchen VITAMIN D, CHOLECALCIFEROL, PO Take 5,000 Units by mouth once a week.       No current facility-administered medications for this visit.    Family History  Problem Relation Age of Onset  . Heart disease Mother     IHSS  . Heart disease Sister   . Cancer Brother     Colon, Liver  . Heart disease Brother   . Cancer Maternal Grandmother     Colon  . Heart disease Brother     ROS:  Pertinent items are noted in HPI.  Otherwise, a comprehensive ROS was negative.  Exam:   BP 138/78  Pulse 60  Resp 20  Ht 5' 2.25" (1.581 m)  Wt 137 lb (62.143 kg)  BMI 24.86 kg/m2  LMP 06/04/2004  Weight change: -2#   Height: 5' 2.25" (158.1 cm)  Ht Readings from Last 3 Encounters:  05/16/14 5' 2.25" (1.581 m)    General appearance: alert, cooperative and appears stated age Head: Normocephalic, without obvious abnormality, atraumatic Neck: no adenopathy, supple, symmetrical, trachea midline and thyroid normal to inspection and palpation Lungs: clear to auscultation bilaterally  Breasts: normal appearance, no masses or tenderness Heart: regular rate and rhythm Abdomen: soft, non-tender; bowel sounds normal; no masses,  no organomegaly Extremities: extremities normal, atraumatic, no cyanosis or edema Skin: Skin color, texture, turgor normal. No rashes or lesions Lymph nodes: Cervical, supraclavicular, and axillary nodes normal. No abnormal inguinal nodes palpated Neurologic: Grossly normal   Pelvic: External genitalia:  no lesions              Urethra:  normal appearing urethra with no masses, tenderness or lesions              Bartholins and Skenes: normal                 Vagina: normal appearing vagina with normal color and discharge, no lesions              Cervix: absent              Pap taken: yes Bimanual Exam:  Uterus:  uterus absent              Adnexa: no mass, fullness, tenderness               Rectovaginal:  Confirms               Anus:  normal sphincter tone, no lesions  A:  Well Woman with normal exam H/O TAH/BSO with IIB endometiral adenocarcinoma Atorphic change, 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  Last colonoscopy was 2006.  Due 2016.  P:   Mammogram yearly.  Does 3D. pap smear obtained today. Labs with Dr. Forde Dandy every six months. return annually or prn  An After Visit Summary was printed and given to the patient.

## 2014-05-19 LAB — IPS PAP SMEAR ONLY

## 2014-10-06 ENCOUNTER — Encounter: Payer: Self-pay | Admitting: Obstetrics & Gynecology

## 2014-11-05 ENCOUNTER — Ambulatory Visit (INDEPENDENT_AMBULATORY_CARE_PROVIDER_SITE_OTHER): Payer: Medicare Other | Admitting: Obstetrics and Gynecology

## 2014-11-05 ENCOUNTER — Encounter: Payer: Self-pay | Admitting: Obstetrics and Gynecology

## 2014-11-05 ENCOUNTER — Telehealth: Payer: Self-pay | Admitting: Obstetrics & Gynecology

## 2014-11-05 VITALS — BP 140/78 | HR 64 | Temp 98.2°F | Resp 18 | Ht 62.25 in | Wt 141.0 lb

## 2014-11-05 DIAGNOSIS — Z8542 Personal history of malignant neoplasm of other parts of uterus: Secondary | ICD-10-CM

## 2014-11-05 DIAGNOSIS — N939 Abnormal uterine and vaginal bleeding, unspecified: Secondary | ICD-10-CM

## 2014-11-05 LAB — POCT URINALYSIS DIPSTICK
Leukocytes, UA: NEGATIVE
PH UA: 5
Urobilinogen, UA: NEGATIVE

## 2014-11-05 LAB — HEMOGLOBIN, FINGERSTICK: HEMOGLOBIN, FINGERSTICK: 14.2 g/dL (ref 12.0–16.0)

## 2014-11-05 NOTE — Telephone Encounter (Signed)
Patient calling to report vaginal bleeding after getting out of the shower this morning. She said, "There was quite a bit of blood."

## 2014-11-05 NOTE — Patient Instructions (Signed)
Please call if you have recurrent bleeding.  You will likely need to go to the Emergency Department.

## 2014-11-05 NOTE — Progress Notes (Signed)
GYNECOLOGY  VISIT   HPI: 80 y.o.   Married  Caucasian  female   G4P3 with Patient's last menstrual period was 06/04/2004.   here for Vaginal Bleeding this morning after shower.  Does not believe it is coming from the rectum.  No dysuria or change in bowel function.  No change in color of stools.  Denies pain.  Not dizzy.   History of endometrial cancer and is status post TAH/BSO in 2005. Had radiation therapy as well.   Not taking any hormonal therapy.   Not sexually active.   No vagina use of medications.   Not taking any blood thinners.  Just on a baby ASA daily.   Pap normal in June 2015.   Urine voided specimen - trace RBCs. Hgb 14.7  GYNECOLOGIC HISTORY: Patient's last menstrual period was 06/04/2004. Contraception: Hysterectomy  Menopausal hormone therapy: No        OB History    Gravida Para Term Preterm AB TAB SAB Ectopic Multiple Living   4 3        3          Patient Active Problem List   Diagnosis Date Noted  . History of endometrial cancer 05/16/2014    Past Medical History  Diagnosis Date  . H/O idiopathic hypertrophic subaortic stenosis   . GERD (gastroesophageal reflux disease)   . Abnormal uterine bleeding   . Osteoporosis   . Diverticulosis 12/2004  . S/P TAH-BSO (total abdominal hysterectomy and bilateral salpingo-oophorectomy) 06/2004  . Adenocarcinoma of endometrium 06/2004    IIB Gr1  . Hyperlipemia     Past Surgical History  Procedure Laterality Date  . Colonoscopy  12/2004  . Breast biopsy      benign tumor removed  . Total abdominal hysterectomy w/ bilateral salpingoophorectomy  7/05    II B, Gr I, adenoca endometrium    Current Outpatient Prescriptions  Medication Sig Dispense Refill  . aspirin 81 MG tablet Take 81 mg by mouth daily.    Marland Kitchen atenolol (TENORMIN) 50 MG tablet Take 50 mg by mouth 2 (two) times daily.    . Calcium Carbonate-Vit D-Min (CALCIUM 1200 PO) Take by mouth daily.    Marland Kitchen esomeprazole (NEXIUM) 40 MG capsule  Take 40 mg by mouth daily at 12 noon.    . niacin 500 MG tablet Take 500 mg by mouth at bedtime.    . Omega-3 Fatty Acids (OMEGA-3 FISH OIL PO) Take by mouth daily.    . simvastatin (ZOCOR) 80 MG tablet Take 80 mg by mouth daily.    Marland Kitchen VITAMIN D, CHOLECALCIFEROL, PO Take 5,000 Units by mouth once a week.     No current facility-administered medications for this visit.     ALLERGIES: Actonel; Estrogens; and Fosamax  Family History  Problem Relation Age of Onset  . Heart disease Mother     IHSS  . Heart disease Sister   . Cancer Brother     Colon, Liver  . Heart disease Brother   . Cancer Maternal Grandmother     Colon  . Heart disease Brother     History   Social History  . Marital Status: Married    Spouse Name: N/A    Number of Children: N/A  . Years of Education: N/A   Occupational History  . Not on file.   Social History Main Topics  . Smoking status: Former Research scientist (life sciences)  . Smokeless tobacco: Never Used     Comment: in her early 84s  .  Alcohol Use: Yes     Comment: 1/2 glass daily  . Drug Use: No  . Sexual Activity: No     Comment: TAH/BSO   Other Topics Concern  . Not on file   Social History Narrative    ROS:  Pertinent items are noted in HPI.  PHYSICAL EXAMINATION:    BP 140/78 mmHg  Pulse 64  Temp(Src) 98.2 F (36.8 C) (Oral)  Resp 18  Ht 5' 2.25" (1.581 m)  Wt 141 lb (63.957 kg)  BMI 25.59 kg/m2  LMP 06/04/2004     General appearance: alert, cooperative and appears stated age Lungs: clear to auscultation bilaterally Heart: regular rate and rhythm Abdomen: soft, non-tender; no masses,  no organomegaly No abnormal inguinal nodes palpated  Pelvic: External genitalia:  no lesions              Urethra:  normal appearing urethra with no masses, tenderness or lesions.  Very minor 2 mm abrasion on either side of the urethral opening.  No active bleeding.               Bartholins and Skenes: normal                 Vagina: normal appearing vagina with  normal color and discharge, no lesions.  Tight caliber.  Adolescent speculum used.               Cervix:  absent                   Bimanual Exam:  Uterus:   absent                                      Adnexa: normal adnexa in size, nontender and no masses                                      Rectovaginal:  Yes.                                                                Confirms                                      Anus:  normal sphincter tone, no lesions.  No blood on my glove.   Catheterization of urine performed under sterile conditions after urethra prepped with iodine.  Clear yellow urine noted. Urine dip repeat - negative.   ASSESSMENT  "vaginal bleeding" episode. Status post TAH/BSO and XRT for stage IIB, Grade I endometrial adenocarcinoma.   PLAN  IFOB to patient. Patient will return tomorrow with this and have a recheck then.  Will then determine if she needs to see GI or have imaging study performed.  Call for any recurrent bleeding.  Will likely need to go to Emergency Dept.     An After Visit Summary was printed and given to the patient.  ___15___ minutes face to face time of which over 50% was spent in counseling.

## 2014-11-05 NOTE — Telephone Encounter (Signed)
Spoke with patient at time of incoming call. She showered this morning and was drying off and noted vaginal bleeding. Hx endometrial cancer. Patient states she is very sure it was vaginal bleeding. Denies complaints at this time. No active bleeding.  Reviewed with Dr. Quincy Simmonds. Patient to be seen today. Spoke with patient and scheduled appointment today with Dr. Quincy Simmonds.  Routing to provider for final review. Patient agreeable to disposition. Will close encounter

## 2014-11-06 ENCOUNTER — Ambulatory Visit (INDEPENDENT_AMBULATORY_CARE_PROVIDER_SITE_OTHER): Payer: Medicare Other | Admitting: Obstetrics and Gynecology

## 2014-11-06 ENCOUNTER — Encounter: Payer: Self-pay | Admitting: Obstetrics and Gynecology

## 2014-11-06 VITALS — BP 104/80 | HR 72 | Resp 12 | Ht 62.25 in | Wt 140.6 lb

## 2014-11-06 DIAGNOSIS — N939 Abnormal uterine and vaginal bleeding, unspecified: Secondary | ICD-10-CM

## 2014-11-06 LAB — FECAL OCCULT BLOOD, IMMUNOCHEMICAL: Fecal Occult Blood: NEGATIVE

## 2014-11-06 NOTE — Progress Notes (Signed)
GYNECOLOGY  VISIT   HPI: 80 y.o.   Married  Caucasian  female   G4P3 with Patient's last menstrual period was 06/04/2004.   here for follow up visit. Status post TAH/BSO for endometrial cancer in 2005.  No further bleeding today.  Denies any pain.   Patient states that she noted the blood after bathing yesterday.  Blood was on the towel.  She took the towel and rubbed near her vulvar area and more blood was noted.   Remembering now that she had a headache prior to bleeding yesterday.  Used to have this before her menses.   IFOB negative.   GYNECOLOGIC HISTORY: Patient's last menstrual period was 06/04/2004.          OB History    Gravida Para Term Preterm AB TAB SAB Ectopic Multiple Living   4 3        3          Patient Active Problem List   Diagnosis Date Noted  . History of endometrial cancer 05/16/2014    Past Medical History  Diagnosis Date  . H/O idiopathic hypertrophic subaortic stenosis   . GERD (gastroesophageal reflux disease)   . Abnormal uterine bleeding   . Osteoporosis   . Diverticulosis 12/2004  . S/P TAH-BSO (total abdominal hysterectomy and bilateral salpingo-oophorectomy) 06/2004  . Adenocarcinoma of endometrium 06/2004    IIB Gr1  . Hyperlipemia     Past Surgical History  Procedure Laterality Date  . Colonoscopy  12/2004  . Breast biopsy      benign tumor removed  . Total abdominal hysterectomy w/ bilateral salpingoophorectomy  7/05    II B, Gr I, adenoca endometrium    Current Outpatient Prescriptions  Medication Sig Dispense Refill  . aspirin 81 MG tablet Take 81 mg by mouth daily.    Marland Kitchen atenolol (TENORMIN) 50 MG tablet Take 50 mg by mouth 2 (two) times daily.    . Calcium Carbonate-Vit D-Min (CALCIUM 1200 PO) Take by mouth daily.    Marland Kitchen esomeprazole (NEXIUM) 40 MG capsule Take 40 mg by mouth daily at 12 noon.    . niacin 500 MG tablet Take 500 mg by mouth at bedtime.    . Omega-3 Fatty Acids (OMEGA-3 FISH OIL PO) Take by mouth daily.     . simvastatin (ZOCOR) 80 MG tablet Take 80 mg by mouth daily.    Marland Kitchen VITAMIN D, CHOLECALCIFEROL, PO Take 5,000 Units by mouth once a week.     No current facility-administered medications for this visit.     ALLERGIES: Actonel; Estrogens; and Fosamax  Family History  Problem Relation Age of Onset  . Heart disease Mother     IHSS  . Heart disease Sister   . Cancer Brother     Colon, Liver  . Heart disease Brother   . Cancer Maternal Grandmother     Colon  . Heart disease Brother     History   Social History  . Marital Status: Married    Spouse Name: N/A    Number of Children: N/A  . Years of Education: N/A   Occupational History  . Not on file.   Social History Main Topics  . Smoking status: Former Research scientist (life sciences)  . Smokeless tobacco: Never Used     Comment: in her early 7s  . Alcohol Use: Yes     Comment: 1/2 glass daily  . Drug Use: No  . Sexual Activity: No     Comment: TAH/BSO   Other  Topics Concern  . Not on file   Social History Narrative    ROS:  Pertinent items are noted in HPI.  PHYSICAL EXAMINATION:    LMP 06/04/2004     General appearance: alert, cooperative and appears stated age    Pelvic: External genitalia:  no lesions              Urethra:  normal appearing urethra with small abrasion  To the right and left of urethra.              Bartholins and Skenes: normal                 Vagina:  Vaginal caliber tight.               Cervix:  absent                   Bimanual Exam:  Uterus:   Absent.                                       Adnexa:  No masses.                                          ASSESSMENT  History of stage IIB, Grade I endometrial cancer.  Status post TAH/BSO.  Status post radiation therapy.  Bleeding likely from periurethral region.  I doubt a recurrence of endometrial cancer.   PLAN  Patient shown the area near her urethra with a mirror today.  Will do observational management.  If bleeding recurs, will do a CT of  abdomen and pelvis.  Patient is in agreement.    An After Visit Summary was printed and given to the patient.  ___15___ minutes face to face time of which over 50% was spent in counseling.

## 2014-11-12 NOTE — Progress Notes (Signed)
Reviewed personally.  M. Suzanne Juwana Thoreson, MD.  

## 2014-12-31 ENCOUNTER — Encounter: Payer: Self-pay | Admitting: Internal Medicine

## 2015-03-02 ENCOUNTER — Encounter: Payer: Self-pay | Admitting: Internal Medicine

## 2015-04-27 ENCOUNTER — Encounter: Payer: Self-pay | Admitting: Internal Medicine

## 2015-04-27 ENCOUNTER — Ambulatory Visit (INDEPENDENT_AMBULATORY_CARE_PROVIDER_SITE_OTHER): Payer: Medicare Other | Admitting: Internal Medicine

## 2015-04-27 VITALS — BP 112/68 | HR 70 | Ht 62.0 in | Wt 136.0 lb

## 2015-04-27 DIAGNOSIS — K5730 Diverticulosis of large intestine without perforation or abscess without bleeding: Secondary | ICD-10-CM

## 2015-04-27 DIAGNOSIS — Z1211 Encounter for screening for malignant neoplasm of colon: Secondary | ICD-10-CM | POA: Diagnosis not present

## 2015-04-27 NOTE — Progress Notes (Signed)
HISTORY OF PRESENT ILLNESS:  [name] is a 81 y.o. female who presents today regarding routine screening colonoscopy after receiving a computer-generated recall letter. Patient underwent initial routine screening colonoscopy January 2006. Examination was normal except for marked left-sided diverticulosis. Patient denies a family history of colon cancer in first-degree relatives. Did have a grandmother diagnosed in her 24s. Patient has routine evaluations with Dr. Forde Dandy. Blood counts have been normal and Hemoccults negative. Patient's GI review of systems is entirely negative.  REVIEW OF SYSTEMS:  All non-GI ROS negative except for occasional irregular heartbeat  Past Medical History  Diagnosis Date  . H/O idiopathic hypertrophic subaortic stenosis   . GERD (gastroesophageal reflux disease)   . Abnormal uterine bleeding   . Osteoporosis   . Diverticulosis 12/2004  . S/P TAH-BSO (total abdominal hysterectomy and bilateral salpingo-oophorectomy) 06/2004  . Adenocarcinoma of endometrium 06/2004    IIB Gr1  . Hyperlipemia     Past Surgical History  Procedure Laterality Date  . Colonoscopy  12/2004  . Breast biopsy      benign tumor removed  . Total abdominal hysterectomy w/ bilateral salpingoophorectomy  7/05    II B, Gr I, adenoca endometrium    Social History [name]  reports that she has quit smoking. She has never used smokeless tobacco. She reports that she drinks alcohol. She reports that she does not use illicit drugs.  family history includes Cancer in her brother and maternal grandmother; Heart disease in her brother, brother, mother, and sister.  Allergies  Allergen Reactions  . Actonel [Risedronate Sodium] Other (See Comments)    "Felt bad"   . Estrogens     Estrogen patch - Blisters  . Fosamax [Alendronate Sodium] Other (See Comments)    "Felt Bad"       PHYSICAL EXAMINATION: Vital signs: BP 112/68 mmHg  Pulse 70  Ht 5\' 2"  (1.575 m)  Wt 136 lb  (61.689 kg)  BMI 24.87 kg/m2  LMP 06/04/2004 General: Well-developed, well-nourished, no acute distress HEENT: Sclerae are anicteric, conjunctiva pink. Oral mucosa intact Lungs: Clear Heart: Regular Abdomen: soft, nontender, nondistended, no obvious ascites, no peritoneal signs, normal bowel sounds. No organomegaly. Extremities: No edema Psychiatric: alert and oriented x3. Cooperative  ASSESSMENT:  #1. The patient presents day regarding screening colonoscopy. Negative index exam 10 years ago except for diverticulosis. She is 81 years old without worrisome signs or symptoms. I discussed with her current guidelines regarding colon cancer screening. Routine screening generally phases out around age 11-79. Thus, no repeat screening at this time recommended or planned. She is pleased. She does understand a colonoscopy could be performed for relevant signs symptoms that they develop.

## 2015-04-27 NOTE — Patient Instructions (Signed)
Follow up as needed with Dr Henrene Pastor. CC:  Reynold Bowen MD

## 2015-06-01 ENCOUNTER — Ambulatory Visit: Payer: Self-pay | Admitting: Obstetrics & Gynecology

## 2015-06-16 ENCOUNTER — Telehealth: Payer: Self-pay | Admitting: Obstetrics & Gynecology

## 2015-06-16 ENCOUNTER — Ambulatory Visit: Payer: Self-pay | Admitting: Obstetrics & Gynecology

## 2015-06-16 NOTE — Telephone Encounter (Signed)
Thanks.  Ok to close encounter. 

## 2015-06-16 NOTE — Telephone Encounter (Signed)
Patient had to canceled appointment for today she needs to get [facility] authorization for her visit. Separate message to [place]. I will call patient once we get the authorization.

## 2015-06-19 ENCOUNTER — Telehealth: Payer: Self-pay | Admitting: Obstetrics & Gynecology

## 2015-06-19 NOTE — Telephone Encounter (Signed)
Called patient and left message to call back to reschedule a cancelled appointment with Dr. Sabra Heck for her [facility]/[place]

## 2015-07-13 ENCOUNTER — Ambulatory Visit (INDEPENDENT_AMBULATORY_CARE_PROVIDER_SITE_OTHER): Payer: Medicare Other | Admitting: Obstetrics & Gynecology

## 2015-07-13 ENCOUNTER — Encounter: Payer: Self-pay | Admitting: Obstetrics & Gynecology

## 2015-07-13 VITALS — BP 136/78 | HR 60 | Resp 16 | Ht 62.0 in | Wt 135.0 lb

## 2015-07-13 DIAGNOSIS — Z8542 Personal history of malignant neoplasm of other parts of uterus: Secondary | ICD-10-CM | POA: Diagnosis not present

## 2015-07-13 DIAGNOSIS — Z124 Encounter for screening for malignant neoplasm of cervix: Secondary | ICD-10-CM | POA: Diagnosis not present

## 2015-07-13 DIAGNOSIS — Z01419 Encounter for gynecological examination (general) (routine) without abnormal findings: Secondary | ICD-10-CM

## 2015-07-13 NOTE — Addendum Note (Signed)
Addended by: Megan Salon on: 07/13/2015 04:28 PM   Modules accepted: Miquel Dunn

## 2015-07-13 NOTE — Progress Notes (Addendum)
81 y.o. G4P3 Widowed CaucasianF here for annual exam.  Doing well.  Pt has episode of vaginal/rectal bleeding in December.  Was bright red and lasted for a few minutes.  Denies bladder or bowel issues.     Did see Dr. Henrene Pastor in May.  Is not going to have further colonoscopies unless she has issues.   PCP:  Dr. Forde Dandy.  Has appt next month.  Will do blood work then.   Patient's last menstrual period was 06/04/2004.          Sexually active: No.  The current method of family planning is status post hysterectomy.    Exercising: No.  not regularly Smoker:  Former smoker-years ago  Health Maintenance: Pap:  05/16/14 WNL History of abnormal Pap:  Yes h/o endometrial cancer MMG:  03/05/15 3D BiRads 2-normal Colonoscopy:  2006-Dr Henrene Pastor no repeat needed, PRN BMD:   2013 TDaP:  2013 Screening Labs: PCP, Hb today: PCP, Urine today: PCP   reports that she has quit smoking. She has never used smokeless tobacco. She reports that she drinks alcohol. She reports that she does not use illicit drugs.  Past Medical History  Diagnosis Date  . H/O idiopathic hypertrophic subaortic stenosis   . GERD (gastroesophageal reflux disease)   . Abnormal uterine bleeding   . Osteoporosis   . Diverticulosis 12/2004  . S/P TAH-BSO (total abdominal hysterectomy and bilateral salpingo-oophorectomy) 06/2004  . Adenocarcinoma of endometrium 06/2004    IIB Gr1  . Hyperlipemia     Past Surgical History  Procedure Laterality Date  . Colonoscopy  12/2004  . Breast biopsy      benign tumor removed  . Total abdominal hysterectomy w/ bilateral salpingoophorectomy  7/05    II B, Gr I, adenoca endometrium    Current Outpatient Prescriptions  Medication Sig Dispense Refill  . aspirin 81 MG tablet Take 81 mg by mouth daily.    Marland Kitchen atenolol (TENORMIN) 50 MG tablet Take 50 mg by mouth 2 (two) times daily.    . Calcium Carbonate-Vit D-Min (CALCIUM 1200 PO) Take by mouth daily.    . niacin 500 MG tablet Take 250 mg by mouth  3 (three) times daily with meals.     . Omega-3 Fatty Acids (OMEGA-3 FISH OIL PO) Take by mouth daily.    . simvastatin (ZOCOR) 80 MG tablet Take 80 mg by mouth daily.    Marland Kitchen VITAMIN D, CHOLECALCIFEROL, PO Take 5,000 Units by mouth once a week.     No current facility-administered medications for this visit.    Family History  Problem Relation Age of Onset  . Heart disease Mother     IHSS  . Heart disease Sister   . Cancer Brother     Colon, Liver  . Heart disease Brother   . Cancer Maternal Grandmother     Colon  . Heart disease Brother     ROS:  Pertinent items are noted in HPI.  Otherwise, a comprehensive ROS was negative.  Exam:   General appearance: alert, cooperative and appears stated age Head: Normocephalic, without obvious abnormality, atraumatic Neck: no adenopathy, supple, symmetrical, trachea midline and thyroid normal to inspection and palpation Lungs: clear to auscultation bilaterally Breasts: normal appearance, no masses or tenderness Heart: regular rate and rhythm Abdomen: soft, non-tender; bowel sounds normal; no masses,  no organomegaly Extremities: extremities normal, atraumatic, no cyanosis or edema Skin: Skin color, texture, turgor normal. No rashes or lesions Lymph nodes: Cervical, supraclavicular, and axillary nodes normal. No  abnormal inguinal nodes palpated Neurologic: Grossly normal   Pelvic: External genitalia:  no lesions              Urethra:  normal appearing urethra with no masses, tenderness or lesions              Bartholins and Skenes: normal                 Vagina: shortened, with atrophic changes and radiation changes, no change from 1 year ago              Cervix: absent              Pap taken: No. Bimanual Exam:  Uterus:  uterus absent              Adnexa: no mass, fullness, tenderness               Rectovaginal: Confirms               Anus:  normal sphincter tone, no lesions  Chaperone was present for exam.  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 Last colonoscopy was 2006. Just saw Dr. Henrene Pastor and will not have another one unless having new issues  P: Mammogram yearly. Does 3D. pap smear obtained today. Labs with Dr. Forde Dandy every six months.  Has appt next month. return annually or prn

## 2015-07-16 LAB — IPS PAP SMEAR ONLY

## 2015-07-23 ENCOUNTER — Encounter: Payer: Self-pay | Admitting: Obstetrics & Gynecology

## 2015-07-23 ENCOUNTER — Ambulatory Visit (INDEPENDENT_AMBULATORY_CARE_PROVIDER_SITE_OTHER): Payer: Medicare Other | Admitting: Obstetrics & Gynecology

## 2015-07-23 VITALS — BP 132/78 | HR 68 | Resp 16 | Wt 136.0 lb

## 2015-07-23 DIAGNOSIS — Z124 Encounter for screening for malignant neoplasm of cervix: Secondary | ICD-10-CM

## 2015-07-23 DIAGNOSIS — R87615 Unsatisfactory cytologic smear of cervix: Secondary | ICD-10-CM

## 2015-07-23 NOTE — Progress Notes (Signed)
81 yo G4P3 WWF here for repeat Pap smear due to insufficient cells noted on Pap obtained 07/13/15 at [facility].  Denies vaginal bleeding.  Reviewed pap results and reason for return.  All questions answered.    Exam:  WNWD WF, NAD GYN:  NAEFG, shorted vagina due to prior surgery and radiation, Pap obtained.  Assessment:  insufficient cells on pap H/O TAH/BSO with IIB endometrial adenocarcinoma 7/05  Plan: Pap repeated.  Pt will be called with results.

## 2015-07-25 LAB — IPS PAP SMEAR ONLY

## 2015-07-27 ENCOUNTER — Telehealth: Payer: Self-pay

## 2015-07-27 NOTE — Telephone Encounter (Signed)
Lmtcb//kn 

## 2015-07-27 NOTE — Telephone Encounter (Signed)
Patient notified of results. Has [facility] scheduled in 11/17//kn

## 2015-07-27 NOTE — Telephone Encounter (Signed)
Patient notified of results.//kn 

## 2015-07-27 NOTE — Telephone Encounter (Signed)
Returned call

## 2015-07-27 NOTE — Telephone Encounter (Signed)
-----   Message from Megan Salon, MD sent at 07/26/2015  3:55 PM EDT ----- Please call and let her know her Pap was normal.  02 recall.

## 2016-02-29 DIAGNOSIS — E559 Vitamin D deficiency, unspecified: Secondary | ICD-10-CM | POA: Diagnosis not present

## 2016-02-29 DIAGNOSIS — M810 Age-related osteoporosis without current pathological fracture: Secondary | ICD-10-CM | POA: Diagnosis not present

## 2016-02-29 DIAGNOSIS — Z1389 Encounter for screening for other disorder: Secondary | ICD-10-CM | POA: Diagnosis not present

## 2016-02-29 DIAGNOSIS — K219 Gastro-esophageal reflux disease without esophagitis: Secondary | ICD-10-CM | POA: Diagnosis not present

## 2016-02-29 DIAGNOSIS — E784 Other hyperlipidemia: Secondary | ICD-10-CM | POA: Diagnosis not present

## 2016-02-29 DIAGNOSIS — I421 Obstructive hypertrophic cardiomyopathy: Secondary | ICD-10-CM | POA: Diagnosis not present

## 2016-02-29 DIAGNOSIS — C541 Malignant neoplasm of endometrium: Secondary | ICD-10-CM | POA: Diagnosis not present

## 2016-02-29 DIAGNOSIS — R7302 Impaired glucose tolerance (oral): Secondary | ICD-10-CM | POA: Diagnosis not present

## 2016-03-09 DIAGNOSIS — Z1231 Encounter for screening mammogram for malignant neoplasm of breast: Secondary | ICD-10-CM | POA: Diagnosis not present

## 2016-08-02 ENCOUNTER — Ambulatory Visit: Payer: Medicare Other | Admitting: Obstetrics & Gynecology

## 2016-08-24 DIAGNOSIS — R7302 Impaired glucose tolerance (oral): Secondary | ICD-10-CM | POA: Diagnosis not present

## 2016-08-24 DIAGNOSIS — R8299 Other abnormal findings in urine: Secondary | ICD-10-CM | POA: Diagnosis not present

## 2016-08-24 DIAGNOSIS — E559 Vitamin D deficiency, unspecified: Secondary | ICD-10-CM | POA: Diagnosis not present

## 2016-08-24 DIAGNOSIS — R358 Other polyuria: Secondary | ICD-10-CM | POA: Diagnosis not present

## 2016-08-24 DIAGNOSIS — E784 Other hyperlipidemia: Secondary | ICD-10-CM | POA: Diagnosis not present

## 2016-08-30 ENCOUNTER — Encounter: Payer: Self-pay | Admitting: Obstetrics & Gynecology

## 2016-08-30 ENCOUNTER — Ambulatory Visit (INDEPENDENT_AMBULATORY_CARE_PROVIDER_SITE_OTHER): Payer: Medicare Other | Admitting: Obstetrics & Gynecology

## 2016-08-30 VITALS — BP 138/64 | HR 56 | Resp 14 | Ht 62.0 in | Wt 141.8 lb

## 2016-08-30 DIAGNOSIS — M810 Age-related osteoporosis without current pathological fracture: Secondary | ICD-10-CM

## 2016-08-30 DIAGNOSIS — L292 Pruritus vulvae: Secondary | ICD-10-CM

## 2016-08-30 DIAGNOSIS — L80 Vitiligo: Secondary | ICD-10-CM | POA: Diagnosis not present

## 2016-08-30 DIAGNOSIS — Z8542 Personal history of malignant neoplasm of other parts of uterus: Secondary | ICD-10-CM | POA: Diagnosis not present

## 2016-08-30 DIAGNOSIS — E785 Hyperlipidemia, unspecified: Secondary | ICD-10-CM | POA: Diagnosis not present

## 2016-08-30 DIAGNOSIS — Z124 Encounter for screening for malignant neoplasm of cervix: Secondary | ICD-10-CM | POA: Diagnosis not present

## 2016-08-30 DIAGNOSIS — Z01419 Encounter for gynecological examination (general) (routine) without abnormal findings: Secondary | ICD-10-CM

## 2016-08-30 MED ORDER — TRIAMCINOLONE ACETONIDE 0.5 % EX OINT: 1.0000 "application " | TOPICAL_OINTMENT | Freq: Two times a day (BID) | CUTANEOUS | 1 refills | Status: DC

## 2016-08-30 NOTE — Progress Notes (Signed)
82 y.o. G4P3 WidowedCaucasianF here for annual exam.  Doing well.  No vaginal bleeding.  Seeing Dr. Forde Dandy next week.  Had blood work done last week.  Has a grandson in [place] getting married in early October.  Will be traveling some next week.  Does have a renter in her [place].  Really happy about this.  Patient's last menstrual period was 06/04/2004.          Sexually active: No.  The current method of family planning is status post hysterectomy.    Exercising: No.  The patient does not participate in regular exercise at present. Smoker:  no  Health Maintenance: Pap:  07/23/15 negative  History of abnormal Pap:  yes MMG:  03/05/15 BIRADS 2 benign  Colonoscopy:  2006 Dr. Henrene Pastor, did have appt last year.  Did not have colonoscopy.  Having stool tests done with Dr. Forde Dandy. BMD:   2013  TDaP:  2013  Pneumonia vaccine(s):  2016 Zostavax:   2016  Hep C testing: not indicated Screening Labs: PCP, Hb today: PCP, Urine today: PCP   reports that she has quit smoking. She has never used smokeless tobacco. She reports that she drinks alcohol. She reports that she does not use drugs.  Past Medical History:  Diagnosis Date  . Abnormal uterine bleeding   . Adenocarcinoma of endometrium 06/2004   IIB Gr1  . Diverticulosis 12/2004  . GERD (gastroesophageal reflux disease)   . H/O idiopathic hypertrophic subaortic stenosis   . Hyperlipemia   . Osteoporosis   . S/P TAH-BSO (total abdominal hysterectomy and bilateral salpingo-oophorectomy) 06/2004    Past Surgical History:  Procedure Laterality Date  . BREAST BIOPSY     benign tumor removed  . COLONOSCOPY  12/2004  . TOTAL ABDOMINAL HYSTERECTOMY W/ BILATERAL SALPINGOOPHORECTOMY  7/05   II B, Gr I, adenoca endometrium    Current Outpatient Prescriptions  Medication Sig Dispense Refill  . aspirin 81 MG tablet Take 81 mg by mouth daily.    Marland Kitchen atenolol (TENORMIN) 50 MG tablet Take 50 mg by mouth 2 (two) times daily.    . Calcium  Carbonate-Vit D-Min (CALCIUM 1200 PO) Take by mouth daily.    . niacin 500 MG tablet Take 250 mg by mouth 3 (three) times daily with meals.     . Omega-3 Fatty Acids (OMEGA-3 FISH OIL PO) Take by mouth daily.    . simvastatin (ZOCOR) 80 MG tablet Take 80 mg by mouth daily.    Marland Kitchen VITAMIN D, CHOLECALCIFEROL, PO Take 5,000 Units by mouth once a week.     No current facility-administered medications for this visit.     Family History  Problem Relation Age of Onset  . Heart disease Mother     IHSS  . Heart disease Sister   . Cancer Brother     Colon, Liver  . Heart disease Brother   . Cancer Maternal Grandmother     Colon  . Heart disease Brother     ROS:  Pertinent items are noted in HPI.  Otherwise, a comprehensive ROS was negative.  Exam:  Vitals:   08/30/16 0928  BP: 138/64  Pulse: (!) 56  Resp: 14    General appearance: alert, cooperative and appears stated age Head: Normocephalic, without obvious abnormality, atraumatic Neck: no adenopathy, supple, symmetrical, trachea midline and thyroid normal to inspection and palpation Lungs: clear to auscultation bilaterally Breasts: normal appearance, no masses or tenderness Heart: regular rate and rhythm Abdomen: soft, non-tender; bowel  sounds normal; no masses,  no organomegaly Extremities: extremities normal, atraumatic, no cyanosis or edema Skin: Skin color, texture, turgor normal. No rashes or lesions.  Hypopigmentation around her neck noted. Lymph nodes: Cervical, supraclavicular, and axillary nodes normal. No abnormal inguinal nodes palpated Neurologic: Grossly normal   Pelvic: External genitalia:  Several areas of hypopigmentation in addition to area around clitoris that has thickened and whitish tissue.  Similar finding noted at pt's left side inferior to introitus.              Urethra:  normal appearing urethra with no masses, tenderness or lesions              Bartholins and Skenes: normal                 Vagina:  short vaginal with atrophic appearance, radiation changes notes, no lesions              Cervix: absent              Pap taken: Yes.   Bimanual Exam:  Uterus:  uterus absent              Adnexa: no mass, fullness, tenderness               Rectovaginal: Confirms               Anus:  normal sphincter tone, no lesions  Chaperone was present for exam.  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 Last colonoscopy was 2006. Saw Dr. Henrene Pastor 2016 and was advised to only have repeat only if she is having problem Vulvar irritation (pt admits after skin changes noted) Hypopigmented neck and vulvar lesions c/w vitiligo  P: Mammogram yearly. she is doing 3D MMG. pap smear obtained today. Labs with Dr. Forde Dandy every six months.  Has appt next week. Kenalog 0.5% ointment BID to affected skin areas.  Recheck 4-6 weeks.  May need biopsy at that time.  Will decreased thickness of tissue before proceeding with biopsy. Referral to derm for new vitiligo findings. Return annually or prn

## 2016-09-01 LAB — IPS PAP SMEAR ONLY

## 2016-09-05 ENCOUNTER — Telehealth: Payer: Self-pay | Admitting: Obstetrics & Gynecology

## 2016-09-05 NOTE — Telephone Encounter (Signed)
Left voicemail regarding referral appointment. The information is listed below. Should the patient need to cancel or reschedule this appointment, please advise them to call the office they've been referred to in order to reschedule.  Left details based on appointment date and per DPR.  [facility] [place] [id number] [phone]  09/06/16 arrive at 1030 with insurance card and photo id.

## 2016-09-06 DIAGNOSIS — L80 Vitiligo: Secondary | ICD-10-CM | POA: Diagnosis not present

## 2016-09-08 DIAGNOSIS — K219 Gastro-esophageal reflux disease without esophagitis: Secondary | ICD-10-CM | POA: Diagnosis not present

## 2016-09-08 DIAGNOSIS — I421 Obstructive hypertrophic cardiomyopathy: Secondary | ICD-10-CM | POA: Diagnosis not present

## 2016-09-08 DIAGNOSIS — E785 Hyperlipidemia, unspecified: Secondary | ICD-10-CM | POA: Diagnosis not present

## 2016-09-08 DIAGNOSIS — M810 Age-related osteoporosis without current pathological fracture: Secondary | ICD-10-CM | POA: Diagnosis not present

## 2016-09-08 DIAGNOSIS — C541 Malignant neoplasm of endometrium: Secondary | ICD-10-CM | POA: Diagnosis not present

## 2016-09-08 DIAGNOSIS — Z1389 Encounter for screening for other disorder: Secondary | ICD-10-CM | POA: Diagnosis not present

## 2016-09-08 DIAGNOSIS — Z0000 Encounter for general adult medical examination without abnormal findings: Secondary | ICD-10-CM | POA: Diagnosis not present

## 2016-09-08 DIAGNOSIS — Z23 Encounter for immunization: Secondary | ICD-10-CM | POA: Diagnosis not present

## 2016-09-08 DIAGNOSIS — R7302 Impaired glucose tolerance (oral): Secondary | ICD-10-CM | POA: Diagnosis not present

## 2016-09-08 DIAGNOSIS — E559 Vitamin D deficiency, unspecified: Secondary | ICD-10-CM | POA: Diagnosis not present

## 2016-10-13 ENCOUNTER — Ambulatory Visit (INDEPENDENT_AMBULATORY_CARE_PROVIDER_SITE_OTHER): Payer: Medicare Other | Admitting: Obstetrics & Gynecology

## 2016-10-13 ENCOUNTER — Encounter: Payer: Self-pay | Admitting: Obstetrics & Gynecology

## 2016-10-13 VITALS — BP 136/70 | HR 68 | Resp 14 | Ht 62.0 in | Wt 142.4 lb

## 2016-10-13 DIAGNOSIS — L80 Vitiligo: Secondary | ICD-10-CM | POA: Diagnosis not present

## 2016-10-13 DIAGNOSIS — L292 Pruritus vulvae: Secondary | ICD-10-CM | POA: Diagnosis not present

## 2016-10-13 NOTE — Progress Notes (Signed)
GYNECOLOGY  VISIT   HPI: 82 y.o. G4P3 Widowed Caucasian female here for follow up after being seen at [facility] with new findings consistent with vitiligo and hypopigmented skin changes with thickened tissue consistent with LSC vs LSA.  Pt was started on topical steroid ointment and reports symptoms resolved very quickly.  At one point, she stopped the steroids.  She did want to see a dermatologist as well and saw the PA at [facility] dermatology.  At that time, she was not having symptoms so was advised to stop the steroid.  She then reports going out of town for a couple of weeks.  When she returned, she started using a new, "expensive", fragrance soap and she started having itching in the same place on her vulva and also in her axilla.  She has stopped and started using the soap and reports she is pretty sure this was the cause of the problem.  She is having some mild itching as she stopped the steroid when advised at the dermatology office and wasn't sure whether to restarted.  Denies vaginal bleeding or urinary symptoms.  GYNECOLOGIC HISTORY: Patient's last menstrual period was 06/04/2004.  Patient Active Problem List   Diagnosis Date Noted  . Vitiligo 08/30/2016  . Osteoporosis 08/30/2016  . Hyperlipidemia 08/30/2016  . History of endometrial cancer 05/16/2014    Past Medical History:  Diagnosis Date  . Diverticulosis 12/2004  . GERD (gastroesophageal reflux disease)   . H/O idiopathic hypertrophic subaortic stenosis   . Hyperlipemia   . Osteoporosis     Past Surgical History:  Procedure Laterality Date  . BREAST BIOPSY     benign tumor removed  . COLONOSCOPY  12/2004  . TOTAL ABDOMINAL HYSTERECTOMY W/ BILATERAL SALPINGOOPHORECTOMY  7/05   II B, Gr I, adenoca endometrium    MEDS:  Reviewed in EPIC and UTD  ALLERGIES: Actonel [risedronate sodium]; Estrogens; and Fosamax [alendronate sodium]  Family History  Problem Relation Age of Onset  . Heart disease Mother     IHSS  . Heart  disease Sister   . Cancer Brother     Colon, Liver  . Heart disease Brother   . Cancer Maternal Grandmother     Colon  . Heart disease Brother     SH:  Widowed, non smoker  Review of Systems  All other systems reviewed and are negative.   PHYSICAL EXAMINATION:    BP 136/70 (BP Location: Right Arm, Patient Position: Sitting, Cuff Size: Normal)   Pulse 68   Resp 14   Ht 5\' 2"  (1.575 m)   Wt 142 lb 6.4 oz (64.6 kg)   LMP 06/04/2004   BMI 26.05 kg/m     Physical Exam  Constitutional: She is oriented to person, place, and time. She appears well-developed and well-nourished.  Genitourinary:    There is no rash, tenderness, lesion or injury on the right labia. There is lesion (as noted in picture) on the left labia. There is no rash, tenderness or injury on the left labia.  Lymphadenopathy:       Right: No inguinal adenopathy present.       Left: No inguinal adenopathy present.  Neurological: She is alert and oriented to person, place, and time.  Skin: Skin is warm and dry.  Psychiatric: She has a normal mood and affect.    Chaperone was present for exam.  Assessment: Vulvar itching, likely Lichen simplex chronicus, due to fragrance soap Vitiligo  Plan: Pt will restart the steroid oinment nightly and  use for two to three nights--until symptoms fully resolved.  She is not going to use the soap any longer.  She will call with any new issues but can use the steroid bid for up to 7 days with a "flare".  She knows to be seen after that if itching still continues. No biopsy done today.   ~15 minutes spent with patient >50% of time was in face to face discussion of above.

## 2016-10-13 NOTE — Patient Instructions (Signed)
Continue the steroid nightly for the next 3-4 weeks in the areas we discussed.  Then you can stop.  If you have a "flare" just restart twice daily for up to 7 days.

## 2016-10-20 ENCOUNTER — Ambulatory Visit: Payer: Medicare Other | Admitting: Obstetrics & Gynecology

## 2017-03-10 DIAGNOSIS — Z1231 Encounter for screening mammogram for malignant neoplasm of breast: Secondary | ICD-10-CM | POA: Diagnosis not present

## 2017-03-14 DIAGNOSIS — K219 Gastro-esophageal reflux disease without esophagitis: Secondary | ICD-10-CM | POA: Diagnosis not present

## 2017-03-14 DIAGNOSIS — R7302 Impaired glucose tolerance (oral): Secondary | ICD-10-CM | POA: Diagnosis not present

## 2017-03-14 DIAGNOSIS — C541 Malignant neoplasm of endometrium: Secondary | ICD-10-CM | POA: Diagnosis not present

## 2017-03-14 DIAGNOSIS — E784 Other hyperlipidemia: Secondary | ICD-10-CM | POA: Diagnosis not present

## 2017-03-15 ENCOUNTER — Other Ambulatory Visit: Payer: Self-pay | Admitting: Endocrinology

## 2017-03-15 DIAGNOSIS — I421 Obstructive hypertrophic cardiomyopathy: Secondary | ICD-10-CM

## 2017-03-24 ENCOUNTER — Ambulatory Visit (HOSPITAL_COMMUNITY): Payer: Medicare Other | Attending: Cardiovascular Disease

## 2017-03-24 ENCOUNTER — Other Ambulatory Visit: Payer: Self-pay

## 2017-03-24 DIAGNOSIS — I340 Nonrheumatic mitral (valve) insufficiency: Secondary | ICD-10-CM | POA: Diagnosis not present

## 2017-03-24 DIAGNOSIS — I421 Obstructive hypertrophic cardiomyopathy: Secondary | ICD-10-CM | POA: Insufficient documentation

## 2017-09-06 DIAGNOSIS — E7849 Other hyperlipidemia: Secondary | ICD-10-CM | POA: Diagnosis not present

## 2017-09-06 DIAGNOSIS — R7302 Impaired glucose tolerance (oral): Secondary | ICD-10-CM | POA: Diagnosis not present

## 2017-09-06 DIAGNOSIS — M810 Age-related osteoporosis without current pathological fracture: Secondary | ICD-10-CM | POA: Diagnosis not present

## 2017-09-19 DIAGNOSIS — Z0000 Encounter for general adult medical examination without abnormal findings: Secondary | ICD-10-CM | POA: Diagnosis not present

## 2017-09-19 DIAGNOSIS — R7302 Impaired glucose tolerance (oral): Secondary | ICD-10-CM | POA: Diagnosis not present

## 2017-09-19 DIAGNOSIS — I421 Obstructive hypertrophic cardiomyopathy: Secondary | ICD-10-CM | POA: Diagnosis not present

## 2017-09-19 DIAGNOSIS — C541 Malignant neoplasm of endometrium: Secondary | ICD-10-CM | POA: Diagnosis not present

## 2017-09-22 DIAGNOSIS — Z1212 Encounter for screening for malignant neoplasm of rectum: Secondary | ICD-10-CM | POA: Diagnosis not present

## 2017-10-16 DIAGNOSIS — M810 Age-related osteoporosis without current pathological fracture: Secondary | ICD-10-CM | POA: Diagnosis not present

## 2017-10-24 DIAGNOSIS — H524 Presbyopia: Secondary | ICD-10-CM | POA: Diagnosis not present

## 2018-01-30 ENCOUNTER — Other Ambulatory Visit: Payer: Self-pay

## 2018-01-30 ENCOUNTER — Ambulatory Visit (INDEPENDENT_AMBULATORY_CARE_PROVIDER_SITE_OTHER): Payer: Medicare Other | Admitting: Obstetrics & Gynecology

## 2018-01-30 ENCOUNTER — Encounter: Payer: Self-pay | Admitting: Obstetrics & Gynecology

## 2018-01-30 ENCOUNTER — Other Ambulatory Visit (HOSPITAL_COMMUNITY)
Admission: RE | Admit: 2018-01-30 | Discharge: 2018-01-30 | Disposition: A | Discharge status: Home / Self Care | Payer: Medicare Other | Source: Ambulatory Visit | Attending: Obstetrics & Gynecology | Admitting: Obstetrics & Gynecology

## 2018-01-30 VITALS — BP 128/70 | HR 64 | Resp 14 | Ht 61.5 in | Wt 143.0 lb

## 2018-01-30 DIAGNOSIS — C541 Malignant neoplasm of endometrium: Secondary | ICD-10-CM | POA: Diagnosis not present

## 2018-01-30 DIAGNOSIS — Z8542 Personal history of malignant neoplasm of other parts of uterus: Secondary | ICD-10-CM | POA: Insufficient documentation

## 2018-01-30 DIAGNOSIS — Z01419 Encounter for gynecological examination (general) (routine) without abnormal findings: Secondary | ICD-10-CM

## 2018-01-30 DIAGNOSIS — Z90710 Acquired absence of both cervix and uterus: Secondary | ICD-10-CM | POA: Diagnosis not present

## 2018-01-30 NOTE — Progress Notes (Signed)
84 y.o. G4P3 WidowedCaucasianF here for annual exam.  Doing well.  Denies vaginal bleeding.    PCP:  Dr. Forde Dandy.  Being seen every six month.  Last appt was in October.  Does blood work with every visit.   Patient's last menstrual period was 06/04/2004.          Sexually active: No.  The current method of family planning is status post hysterectomy.    Exercising: No.   Smoker:  no  Health Maintenance: Pap:  08/30/16 neg   07/23/15 Neg  History of abnormal Pap:  Yes, H/O TAH/BSO with IIB endometrial adenocarcinoma 7/05 MMG:  03/10/17 BIRADS1:Neg. Has appt 03/12/18 Colonoscopy:  2006 normal. 11/06/14 IFOB Neg  BMD:   2013 TDaP:  2013 Pneumonia vaccine(s):  2016 Shingrix:   Zostavax 2016 Hep C testing: n/a Screening Labs: PCP   reports that she has quit smoking. she has never used smokeless tobacco. She reports that she drinks alcohol. She reports that she does not use drugs.  Past Medical History:  Diagnosis Date  . Diverticulosis 12/2004  . GERD (gastroesophageal reflux disease)   . H/O idiopathic hypertrophic subaortic stenosis   . Hyperlipemia   . Osteoporosis     Past Surgical History:  Procedure Laterality Date  . BREAST BIOPSY     benign tumor removed  . COLONOSCOPY  12/2004  . TOTAL ABDOMINAL HYSTERECTOMY W/ BILATERAL SALPINGOOPHORECTOMY  7/05   II B, Gr I, adenoca endometrium    Current Outpatient Medications  Medication Sig Dispense Refill  . aspirin 81 MG tablet Take 81 mg by mouth daily.    Marland Kitchen atenolol (TENORMIN) 50 MG tablet Take 50 mg by mouth 2 (two) times daily.    . Calcium Carbonate-Vit D-Min (CALCIUM 1200 PO) Take by mouth daily.    . niacin 500 MG tablet Take 250 mg by mouth 3 (three) times daily with meals.     . Omega-3 Fatty Acids (OMEGA-3 FISH OIL PO) Take by mouth daily.    . simvastatin (ZOCOR) 80 MG tablet Take 80 mg by mouth daily.    Marland Kitchen VITAMIN D, CHOLECALCIFEROL, PO Take 5,000 Units by mouth as directed.     Marland Kitchen amoxicillin (AMOXIL) 500 MG capsule    1   No current facility-administered medications for this visit.     Family History  Problem Relation Age of Onset  . Heart disease Mother        IHSS  . Heart disease Sister   . Cancer Brother        Colon, Liver  . Heart disease Brother   . Cancer Maternal Grandmother        Colon  . Heart disease Brother     ROS:  Pertinent items are noted in HPI.  Otherwise, a comprehensive ROS was negative.  Exam:   BP 128/70 (BP Location: Right Arm, Patient Position: Sitting, Cuff Size: Normal)   Pulse 64   Resp 14   Ht 5' 1.5" (1.562 m)   Wt 143 lb (64.9 kg)   LMP 06/04/2004   BMI 26.58 kg/m    Height: 5' 1.5" (156.2 cm)  Ht Readings from Last 3 Encounters:  01/30/18 5' 1.5" (1.562 m)  10/13/16 5\' 2"  (1.575 m)  08/30/16 5\' 2"  (1.575 m)    General appearance: alert, cooperative and appears stated age Head: Normocephalic, without obvious abnormality, atraumatic Neck: no adenopathy, supple, symmetrical, trachea midline and thyroid normal to inspection and palpation Lungs: clear to auscultation bilaterally Breasts: normal appearance,  no masses or tenderness Heart: regular rate and rhythm Abdomen: soft, non-tender; bowel sounds normal; no masses,  no organomegaly Extremities: extremities normal, atraumatic, no cyanosis or edema Skin: Skin color, texture, turgor normal. No rashes or lesions, hypopigmentation around neck Lymph nodes: Cervical, supraclavicular, and axillary nodes normal. No abnormal inguinal nodes palpated Neurologic: Grossly normal   Pelvic: External genitalia:  no lesions, hypopigmentation of most of labia majora/minora              Urethra:  normal appearing urethra with no masses, tenderness or lesions              Bartholins and Skenes: normal                 Vagina: shortened with atrophic tissue and radiation changes noted--stable finidngs              Cervix: absent              Pap taken: Yes.   Bimanual Exam:  Uterus:  uterus absent               Adnexa: no mass, fullness, tenderness               Rectovaginal: Confirms               Anus:  normal sphincter tone, no lesions  Chaperone was present for exam.  A:  Well Woman with normal exam H/O TAH/BSO with IIB endometrial adenocarcinoma 7/05 Shortened vaginal tissue due to radiation H/O elevated lipids H/O colon cancer in family.  Saw Dr. Henrene Pastor in 2016 and was advised no additional follow up needed unless having some issues Vitiligo, that has progressed since last year  P:   Mammogram pap guidelines reviewed.  Still doing yearly Pap smear obtained Sees Dr. Forde Dandy every six months Did see Dermatology last year regarding Vitiligo.  No additional recommendations made. Return annually or prn

## 2018-01-31 LAB — CYTOLOGY - PAP: DIAGNOSIS: NEGATIVE

## 2018-02-05 ENCOUNTER — Encounter: Payer: Self-pay | Admitting: Obstetrics & Gynecology

## 2018-03-12 ENCOUNTER — Encounter: Payer: Self-pay | Admitting: Obstetrics & Gynecology

## 2018-03-12 DIAGNOSIS — Z1231 Encounter for screening mammogram for malignant neoplasm of breast: Secondary | ICD-10-CM | POA: Diagnosis not present

## 2018-03-19 DIAGNOSIS — R7302 Impaired glucose tolerance (oral): Secondary | ICD-10-CM | POA: Diagnosis not present

## 2018-03-19 DIAGNOSIS — C541 Malignant neoplasm of endometrium: Secondary | ICD-10-CM | POA: Diagnosis not present

## 2018-03-19 DIAGNOSIS — E7849 Other hyperlipidemia: Secondary | ICD-10-CM | POA: Diagnosis not present

## 2018-03-19 DIAGNOSIS — I421 Obstructive hypertrophic cardiomyopathy: Secondary | ICD-10-CM | POA: Diagnosis not present

## 2018-09-01 DIAGNOSIS — Z23 Encounter for immunization: Secondary | ICD-10-CM | POA: Diagnosis not present

## 2018-09-18 DIAGNOSIS — R7302 Impaired glucose tolerance (oral): Secondary | ICD-10-CM | POA: Diagnosis not present

## 2018-09-18 DIAGNOSIS — E7849 Other hyperlipidemia: Secondary | ICD-10-CM | POA: Diagnosis not present

## 2018-09-18 DIAGNOSIS — R82998 Other abnormal findings in urine: Secondary | ICD-10-CM | POA: Diagnosis not present

## 2018-09-18 DIAGNOSIS — M810 Age-related osteoporosis without current pathological fracture: Secondary | ICD-10-CM | POA: Diagnosis not present

## 2018-09-25 DIAGNOSIS — R7302 Impaired glucose tolerance (oral): Secondary | ICD-10-CM | POA: Diagnosis not present

## 2018-09-25 DIAGNOSIS — Z0000 Encounter for general adult medical examination without abnormal findings: Secondary | ICD-10-CM | POA: Diagnosis not present

## 2018-09-25 DIAGNOSIS — I421 Obstructive hypertrophic cardiomyopathy: Secondary | ICD-10-CM | POA: Diagnosis not present

## 2018-09-25 DIAGNOSIS — C541 Malignant neoplasm of endometrium: Secondary | ICD-10-CM | POA: Diagnosis not present

## 2018-09-28 DIAGNOSIS — Z1212 Encounter for screening for malignant neoplasm of rectum: Secondary | ICD-10-CM | POA: Diagnosis not present

## 2018-10-29 DIAGNOSIS — H524 Presbyopia: Secondary | ICD-10-CM | POA: Diagnosis not present

## 2018-12-14 DIAGNOSIS — Z6826 Body mass index (BMI) 26.0-26.9, adult: Secondary | ICD-10-CM | POA: Diagnosis not present

## 2018-12-14 DIAGNOSIS — J069 Acute upper respiratory infection, unspecified: Secondary | ICD-10-CM | POA: Diagnosis not present

## 2019-02-05 ENCOUNTER — Other Ambulatory Visit: Payer: Self-pay

## 2019-02-05 ENCOUNTER — Other Ambulatory Visit (HOSPITAL_COMMUNITY)
Admission: RE | Admit: 2019-02-05 | Discharge: 2019-02-05 | Disposition: A | Discharge status: Home / Self Care | Payer: Medicare Other | Source: Ambulatory Visit | Attending: Obstetrics & Gynecology | Admitting: Obstetrics & Gynecology

## 2019-02-05 ENCOUNTER — Ambulatory Visit (INDEPENDENT_AMBULATORY_CARE_PROVIDER_SITE_OTHER): Payer: Medicare Other | Admitting: Obstetrics & Gynecology

## 2019-02-05 ENCOUNTER — Encounter: Payer: Self-pay | Admitting: Obstetrics & Gynecology

## 2019-02-05 VITALS — BP 146/82 | HR 60 | Resp 16 | Ht 62.0 in | Wt 141.4 lb

## 2019-02-05 DIAGNOSIS — Z8542 Personal history of malignant neoplasm of other parts of uterus: Secondary | ICD-10-CM | POA: Insufficient documentation

## 2019-02-05 DIAGNOSIS — Z1151 Encounter for screening for human papillomavirus (HPV): Secondary | ICD-10-CM | POA: Diagnosis not present

## 2019-02-05 DIAGNOSIS — Z923 Personal history of irradiation: Secondary | ICD-10-CM | POA: Diagnosis not present

## 2019-02-05 DIAGNOSIS — Z90710 Acquired absence of both cervix and uterus: Secondary | ICD-10-CM | POA: Diagnosis not present

## 2019-02-05 DIAGNOSIS — Z01419 Encounter for gynecological examination (general) (routine) without abnormal findings: Principal | ICD-10-CM

## 2019-02-05 DIAGNOSIS — C541 Malignant neoplasm of endometrium: Secondary | ICD-10-CM | POA: Diagnosis not present

## 2019-02-05 DIAGNOSIS — Z01411 Encounter for gynecological examination (general) (routine) with abnormal findings: Secondary | ICD-10-CM | POA: Insufficient documentation

## 2019-02-05 DIAGNOSIS — R87610 Atypical squamous cells of undetermined significance on cytologic smear of cervix (ASC-US): Secondary | ICD-10-CM | POA: Insufficient documentation

## 2019-02-05 DIAGNOSIS — Z90722 Acquired absence of ovaries, bilateral: Secondary | ICD-10-CM

## 2019-02-05 NOTE — Progress Notes (Signed)
85 y.o. G4P3 Widowed White or Caucasian female here for annual exam.  Doing well.  [place]    Denies vaginal bleeding.    PCP:  Dr. Forde Dandy.  Last appt was in October.  Has appt now every year.  This was changed from every six months.    Patient's last menstrual period was 06/04/2004.          Sexually active: No.  The current method of family planning is status post hysterectomy.    Exercising: No.   Smoker:  no  Health Maintenance: Pap:  01/30/18 Neg   08/30/16 Neg  History of abnormal Pap:  yes MMG:  03/12/18 [facility] faxing report. Has appt 03/18/19 Colonoscopy:  2006. Ifob 2015 Neg.  Has seen Dr. Henrene Pastor and did not recommend another one. BMD:   Done with Dr. Forde Dandy TDaP:  2013 Pneumonia vaccine(s):  2016 Shingrix: Zostavax completed Hep C testing: n/a Screening Labs: PCP   reports that she has quit smoking. She has never used smokeless tobacco. She reports current alcohol use of about 4.0 standard drinks of alcohol per week. She reports that she does not use drugs.  Past Medical History:  Diagnosis Date  . Diverticulosis 12/2004  . GERD (gastroesophageal reflux disease)   . H/O idiopathic hypertrophic subaortic stenosis   . Hyperlipemia   . Osteoporosis     Past Surgical History:  Procedure Laterality Date  . BREAST BIOPSY     benign tumor removed  . COLONOSCOPY  12/2004  . TOTAL ABDOMINAL HYSTERECTOMY W/ BILATERAL SALPINGOOPHORECTOMY  7/05   II B, Gr I, adenoca endometrium    Current Outpatient Medications  Medication Sig Dispense Refill  . aspirin 81 MG tablet Take 81 mg by mouth daily.    Marland Kitchen atenolol (TENORMIN) 50 MG tablet Take 50 mg by mouth 2 (two) times daily.    . Calcium Carbonate-Vit D-Min (CALCIUM 1200 PO) Take by mouth daily.    . niacin 500 MG tablet Take 250 mg by mouth 3 (three) times daily with meals.     . Omega-3 Fatty Acids (OMEGA-3 FISH OIL PO) Take by mouth daily.    . simvastatin (ZOCOR) 80 MG tablet Take 80 mg by mouth daily.    Marland Kitchen  VITAMIN D, CHOLECALCIFEROL, PO Take 5,000 Units by mouth as directed.     Marland Kitchen amoxicillin (AMOXIL) 500 MG capsule   1   No current facility-administered medications for this visit.     Family History  Problem Relation Age of Onset  . Heart disease Mother        IHSS  . Heart disease Sister   . Cancer Brother        Colon, Liver  . Heart disease Brother   . Cancer Maternal Grandmother        Colon  . Heart disease Brother     Review of Systems  Gastrointestinal: Positive for diarrhea.  All other systems reviewed and are negative.   Exam:   BP (!) 146/82 (BP Location: Right Arm, Patient Position: Sitting, Cuff Size: Large)   Pulse 60   Resp 16   Ht 5\' 2"  (1.575 m)   Wt 141 lb 6.4 oz (64.1 kg)   LMP 06/04/2004   BMI 25.86 kg/m   Height: 5\' 2"  (157.5 cm)  Ht Readings from Last 3 Encounters:  02/05/19 5\' 2"  (1.575 m)  01/30/18 5' 1.5" (1.562 m)  10/13/16 5\' 2"  (1.575 m)    General appearance: alert, cooperative and  appears stated age Head: Normocephalic, without obvious abnormality, atraumatic Neck: no adenopathy, supple, symmetrical, trachea midline and thyroid normal to inspection and palpation Lungs: clear to auscultation bilaterally Breasts: normal appearance, no masses or tenderness Heart: regular rate and rhythm Abdomen: soft, non-tender; bowel sounds normal; no masses,  no organomegaly Extremities: extremities normal, atraumatic, no cyanosis or edema Skin: Skin color, texture, turgor normal. No rashes or lesions Lymph nodes: Cervical, supraclavicular, and axillary nodes normal. No abnormal inguinal nodes palpated Neurologic: Grossly normal   Pelvic: External genitalia:  no lesions              Urethra:  normal appearing urethra with no masses, tenderness or lesions              Bartholins and Skenes: normal                 Vagina: shorted, atrophic tissue.  Radiation changes present and stable              Cervix: absent              Pap taken: Yes.    Bimanual Exam:  Uterus:  uterus absent              Adnexa: no mass, fullness, tenderness               Rectovaginal: Confirms               Anus:  normal sphincter tone, no lesions  Chaperone was present for exam.  A:  Well Woman with normal exam PMP, no HRT H/o TAH/BSO with IIB endometrial adenocarcinoma 7/05 H/o vaginal radiation with shortened vagina H/o elevated lipids Family hx of colon cancer in family.  Last apt was in 2016 was with Dr. Henrene Pastor. Vetiligo  P:   Mammogram guidelines reviewed.  Doing yearly pap smear obtained today Lab work with Dr. Forde Dandy done yearly.  Appt has scheduled in October Reviewed vaccines.  Not sure she wants Shingrix vaccination.  Does not need colonoscopy unless has significant and new problem. return annually or prn

## 2019-02-08 LAB — CYTOLOGY - PAP
DIAGNOSIS: UNDETERMINED — AB
HPV: NOT DETECTED

## 2019-05-24 ENCOUNTER — Encounter: Payer: Self-pay | Admitting: Obstetrics & Gynecology

## 2019-05-24 DIAGNOSIS — Z1231 Encounter for screening mammogram for malignant neoplasm of breast: Secondary | ICD-10-CM | POA: Diagnosis not present

## 2019-08-17 DIAGNOSIS — Z23 Encounter for immunization: Secondary | ICD-10-CM | POA: Diagnosis not present

## 2019-09-23 DIAGNOSIS — E559 Vitamin D deficiency, unspecified: Secondary | ICD-10-CM | POA: Diagnosis not present

## 2019-09-23 DIAGNOSIS — E7849 Other hyperlipidemia: Secondary | ICD-10-CM | POA: Diagnosis not present

## 2019-09-23 DIAGNOSIS — R7302 Impaired glucose tolerance (oral): Secondary | ICD-10-CM | POA: Diagnosis not present

## 2019-09-23 DIAGNOSIS — Z79899 Other long term (current) drug therapy: Secondary | ICD-10-CM | POA: Diagnosis not present

## 2019-09-30 DIAGNOSIS — Z1212 Encounter for screening for malignant neoplasm of rectum: Secondary | ICD-10-CM | POA: Diagnosis not present

## 2019-09-30 DIAGNOSIS — R82998 Other abnormal findings in urine: Secondary | ICD-10-CM | POA: Diagnosis not present

## 2020-01-03 ENCOUNTER — Ambulatory Visit: Payer: Medicare Other

## 2020-04-02 DIAGNOSIS — R7302 Impaired glucose tolerance (oral): Secondary | ICD-10-CM | POA: Diagnosis not present

## 2020-04-02 DIAGNOSIS — D696 Thrombocytopenia, unspecified: Secondary | ICD-10-CM | POA: Diagnosis not present

## 2020-04-02 DIAGNOSIS — C541 Malignant neoplasm of endometrium: Secondary | ICD-10-CM | POA: Diagnosis not present

## 2020-04-02 DIAGNOSIS — E7849 Other hyperlipidemia: Secondary | ICD-10-CM | POA: Diagnosis not present

## 2020-06-17 DIAGNOSIS — Z1231 Encounter for screening mammogram for malignant neoplasm of breast: Secondary | ICD-10-CM | POA: Diagnosis not present

## 2020-06-23 NOTE — Progress Notes (Signed)
86 y.o. G4P3 Widowed White or Caucasian female here for annual exam.  Doing well.  She and significant other are working on seeing all 50 states.  They just went to [place] and have now seen 26 states together.     Patrick Jupiter and his wife took over her [place] and is doing a lot of work on it.  Granddaughter is taking a job in [place].  Just graduated from from [employer].  Grandson graduated from [employer] this year.  He is working in [place] this summer at a youth center for boys.    Denies vaginal bleeding.    Patient's last menstrual period was 06/04/2004.          Sexually active: No.  The current method of family planning is status post hysterectomy.    Exercising: Yes.    yardwork Smoker:  no  Health Maintenance: Pap:  01-30-18 neg, 02-05-2019 ASCUS HPV HR neg History of abnormal Pap:  yes MMG:  05-24-2019 category b density birads 1:neg, had mmg done this week.  Received a letter yesterday.   Colonoscopy:  2006,  ifob neg in 2015 BMD:   Has this scheduled in October with [facility]:  2013 Pneumonia vaccine(s):  2016 Shingrix:   zostavax done Hep C testing: not done Screening Labs: saw Dr. Forde Dandy in march, 2021.  Being seen every 6 months.     reports that she has quit smoking. She has never used smokeless tobacco. She reports current alcohol use. She reports that she does not use drugs.  Past Medical History:  Diagnosis Date  . Diverticulosis 12/2004  . GERD (gastroesophageal reflux disease)   . H/O idiopathic hypertrophic subaortic stenosis   . Hyperlipemia   . Osteoporosis     Past Surgical History:  Procedure Laterality Date  . BREAST BIOPSY     benign tumor removed  . COLONOSCOPY  12/2004  . TOTAL ABDOMINAL HYSTERECTOMY W/ BILATERAL SALPINGOOPHORECTOMY  7/05   II B, Gr I, adenoca endometrium    Current Outpatient Medications  Medication Sig Dispense Refill  . aspirin 81 MG tablet Take 81 mg by mouth daily.    Marland Kitchen atenolol (TENORMIN) 50 MG tablet Take 50 mg by  mouth 2 (two) times daily.    . Calcium Carbonate-Vit D-Min (CALCIUM 1200 PO) Take by mouth daily.    . niacin 500 MG tablet Take 250 mg by mouth 3 (three) times daily with meals.     . Omega-3 Fatty Acids (OMEGA-3 FISH OIL PO) Take by mouth daily.    . simvastatin (ZOCOR) 80 MG tablet Take 80 mg by mouth daily.    Marland Kitchen VITAMIN D, CHOLECALCIFEROL, PO Take 5,000 Units by mouth as directed.     Marland Kitchen amoxicillin (AMOXIL) 500 MG capsule  (Patient not taking: Reported on 06/26/2020)  1   No current facility-administered medications for this visit.    Family History  Problem Relation Age of Onset  . Heart disease Mother        IHSS  . Heart disease Sister   . Cancer Brother        Colon, Liver  . Heart disease Brother   . Cancer Maternal Grandmother        Colon  . Heart disease Brother     Review of Systems  Constitutional: Negative.   HENT: Negative.   Eyes: Negative.   Respiratory: Negative.   Cardiovascular: Negative.   Gastrointestinal: Negative.   Endocrine: Negative.   Genitourinary: Negative.   Musculoskeletal:  Negative.   Skin: Negative.   Allergic/Immunologic: Negative.   Neurological: Negative.   Hematological: Negative.   Psychiatric/Behavioral: Negative.     Exam:   BP 118/80   Pulse 70   Resp 16   Ht 5' 1.75" (1.568 m)   Wt 141 lb (64 kg)   LMP 06/04/2004   BMI 26.00 kg/m   Height: 5' 1.75" (156.8 cm)  General appearance: alert, cooperative and appears stated age Head: Normocephalic, without obvious abnormality, atraumatic Neck: no adenopathy, supple, symmetrical, trachea midline and thyroid normal to inspection and palpation Lungs: clear to auscultation bilaterally Breasts: normal appearance, no masses or tenderness Heart: regular rate and rhythm Abdomen: soft, non-tender; bowel sounds normal; no masses,  no organomegaly Extremities: extremities normal, atraumatic, no cyanosis or edema Skin: Skin color, texture, turgor normal. Hypopigmentation underneath  breasts, on abdominal skin Lymph nodes: Cervical, supraclavicular, and axillary nodes normal. No abnormal inguinal nodes palpated Neurologic: Grossly normal   Pelvic: External genitalia:  no lesions, hypopigmentation of inner labia majora and around rectum c/w vitiligo              Urethra:  normal appearing urethra with no masses, tenderness or lesions              Bartholins and Skenes: normal                 Vagina: Shortened and narrow, atrophic changes, stable radiation changes              Cervix: absent              Pap taken: Yes.   Bimanual Exam:  Uterus:  uterus absent              Adnexa: no mass, fullness, tenderness               Rectovaginal: Confirms               Anus:  normal sphincter tone, no lesions  Chaperone, Terence Lux, CMA, was present for exam.  A:  Well Woman with normal exam PMP, no HRT H/o TAH/BOS with IIB endometrial adenocarcinoma 7/02 H/o vaginal radiation changes with shortened vagina Elevated lipids Family hx of colon cancer in family.  Last appt was in 2016 with Dr. Henrene Pastor and does not need colonoscopy follow up unless has new issues. Vitiligo, extended beneath breasts this year  P:   Mammogram guidelines reviewed.  Continues to do yearly. pap smear not indicated for endometrial cancer follow up.  Pt is comfortable with stopping pap smears this year.  We have discussed this in the past. Has appt in October with Dr. Forde Dandy Will plan to have BMD done in October with Dr. Forde Dandy Vaccines reviewed return annually or prn

## 2020-06-26 ENCOUNTER — Encounter: Payer: Self-pay | Admitting: Obstetrics & Gynecology

## 2020-06-26 ENCOUNTER — Other Ambulatory Visit: Payer: Self-pay

## 2020-06-26 ENCOUNTER — Ambulatory Visit (INDEPENDENT_AMBULATORY_CARE_PROVIDER_SITE_OTHER): Payer: Medicare Other | Admitting: Obstetrics & Gynecology

## 2020-06-26 VITALS — BP 118/80 | HR 70 | Resp 16 | Ht 61.75 in | Wt 141.0 lb

## 2020-06-26 DIAGNOSIS — Z01419 Encounter for gynecological examination (general) (routine) without abnormal findings: Secondary | ICD-10-CM

## 2020-09-05 DIAGNOSIS — Z23 Encounter for immunization: Secondary | ICD-10-CM | POA: Diagnosis not present

## 2020-09-29 DIAGNOSIS — R7302 Impaired glucose tolerance (oral): Secondary | ICD-10-CM | POA: Diagnosis not present

## 2020-09-29 DIAGNOSIS — E785 Hyperlipidemia, unspecified: Secondary | ICD-10-CM | POA: Diagnosis not present

## 2020-09-29 DIAGNOSIS — E559 Vitamin D deficiency, unspecified: Secondary | ICD-10-CM | POA: Diagnosis not present

## 2020-10-06 DIAGNOSIS — R82998 Other abnormal findings in urine: Secondary | ICD-10-CM | POA: Diagnosis not present

## 2020-10-21 DIAGNOSIS — H524 Presbyopia: Secondary | ICD-10-CM | POA: Diagnosis not present

## 2020-11-02 DIAGNOSIS — Z1212 Encounter for screening for malignant neoplasm of rectum: Secondary | ICD-10-CM | POA: Diagnosis not present

## 2021-04-08 DIAGNOSIS — M810 Age-related osteoporosis without current pathological fracture: Secondary | ICD-10-CM | POA: Diagnosis not present

## 2021-04-08 DIAGNOSIS — R7302 Impaired glucose tolerance (oral): Secondary | ICD-10-CM | POA: Diagnosis not present

## 2021-04-08 DIAGNOSIS — I421 Obstructive hypertrophic cardiomyopathy: Secondary | ICD-10-CM | POA: Diagnosis not present

## 2021-04-08 DIAGNOSIS — D696 Thrombocytopenia, unspecified: Secondary | ICD-10-CM | POA: Diagnosis not present

## 2021-05-20 DIAGNOSIS — M5030 Other cervical disc degeneration, unspecified cervical region: Secondary | ICD-10-CM | POA: Diagnosis not present

## 2021-06-18 DIAGNOSIS — Z1231 Encounter for screening mammogram for malignant neoplasm of breast: Secondary | ICD-10-CM | POA: Diagnosis not present

## 2021-06-24 ENCOUNTER — Encounter (HOSPITAL_BASED_OUTPATIENT_CLINIC_OR_DEPARTMENT_OTHER): Payer: Self-pay | Admitting: Obstetrics & Gynecology

## 2021-10-12 DIAGNOSIS — R7302 Impaired glucose tolerance (oral): Secondary | ICD-10-CM | POA: Diagnosis not present

## 2021-10-12 DIAGNOSIS — E559 Vitamin D deficiency, unspecified: Secondary | ICD-10-CM | POA: Diagnosis not present

## 2021-10-12 DIAGNOSIS — E785 Hyperlipidemia, unspecified: Secondary | ICD-10-CM | POA: Diagnosis not present

## 2021-10-19 DIAGNOSIS — R82998 Other abnormal findings in urine: Secondary | ICD-10-CM | POA: Diagnosis not present

## 2021-11-01 DIAGNOSIS — H524 Presbyopia: Secondary | ICD-10-CM | POA: Diagnosis not present

## 2022-01-31 DIAGNOSIS — H524 Presbyopia: Secondary | ICD-10-CM | POA: Diagnosis not present

## 2022-04-27 DIAGNOSIS — M810 Age-related osteoporosis without current pathological fracture: Secondary | ICD-10-CM | POA: Diagnosis not present

## 2022-04-27 DIAGNOSIS — R7302 Impaired glucose tolerance (oral): Secondary | ICD-10-CM | POA: Diagnosis not present

## 2022-04-27 DIAGNOSIS — I421 Obstructive hypertrophic cardiomyopathy: Secondary | ICD-10-CM | POA: Diagnosis not present

## 2022-04-27 DIAGNOSIS — E785 Hyperlipidemia, unspecified: Secondary | ICD-10-CM | POA: Diagnosis not present

## 2022-06-27 ENCOUNTER — Ambulatory Visit (INDEPENDENT_AMBULATORY_CARE_PROVIDER_SITE_OTHER): Payer: Medicare Other | Admitting: Obstetrics & Gynecology

## 2022-06-27 ENCOUNTER — Encounter (HOSPITAL_BASED_OUTPATIENT_CLINIC_OR_DEPARTMENT_OTHER): Payer: Self-pay | Admitting: Obstetrics & Gynecology

## 2022-06-27 VITALS — BP 146/64 | HR 60 | Ht 62.5 in | Wt 143.6 lb

## 2022-06-27 DIAGNOSIS — Z01411 Encounter for gynecological examination (general) (routine) with abnormal findings: Secondary | ICD-10-CM | POA: Diagnosis not present

## 2022-06-27 DIAGNOSIS — M818 Other osteoporosis without current pathological fracture: Secondary | ICD-10-CM | POA: Diagnosis not present

## 2022-06-27 DIAGNOSIS — Z923 Personal history of irradiation: Secondary | ICD-10-CM

## 2022-06-27 DIAGNOSIS — Z8542 Personal history of malignant neoplasm of other parts of uterus: Secondary | ICD-10-CM | POA: Diagnosis not present

## 2022-06-27 DIAGNOSIS — L80 Vitiligo: Secondary | ICD-10-CM

## 2022-06-27 MED ORDER — MOMETASONE FUROATE 0.1 % EX OINT: TOPICAL_OINTMENT | CUTANEOUS | 3 refills | Status: AC

## 2022-06-27 NOTE — Progress Notes (Unsigned)
88 y.o. G4P3 Widowed White or Caucasian female here for breast and pelvic exam.  I am also following her for history of endometrial cancer.  Denies vaginal bleeding.    Having issues with diarrhea if eats gluten.  Will have some fecal incontinence when this happens.  Does wear a panty liner when this occurs.  Did do physical therapy in the past.  Vitiligo is worse especially around the neck since I saw her last.   Patient's last menstrual period was 06/04/2004.          Sexually active: No.  H/O STD:  no  Health Maintenance: PCP:  Dr. Forde Dandy.  Last wellness appt was 03/2022.  Did blood work at that appt: yes Vaccines are up to date:  has done zostavax, did pneumonia vaccinations, tdap last 2013.  Pt will plan to update with Dr. Forde Dandy  Colonoscopy:  12/23/2004, no dedicated follow up recommended MMG:  06/18/2021 Negative BMD:  does in Dr. Baldwin Crown office Last pap smear:  02/05/2019 [facility].     reports that she has quit smoking. She has never used smokeless tobacco. She reports current alcohol use. She reports that she does not use drugs.  Past Medical History:  Diagnosis Date   Diverticulosis 12/2004   GERD (gastroesophageal reflux disease)    H/O idiopathic hypertrophic subaortic stenosis    Hyperlipemia    Osteoporosis     Past Surgical History:  Procedure Laterality Date   BREAST BIOPSY     benign tumor removed   COLONOSCOPY  12/2004   TOTAL ABDOMINAL HYSTERECTOMY W/ BILATERAL SALPINGOOPHORECTOMY  7/05   II B, Gr I, adenoca endometrium    Current Outpatient Medications  Medication Sig Dispense Refill   amoxicillin (AMOXIL) 500 MG capsule   1   aspirin 81 MG tablet Take 81 mg by mouth daily.     atenolol (TENORMIN) 50 MG tablet Take 50 mg by mouth 2 (two) times daily.     Calcium Carbonate-Vit D-Min (CALCIUM 1200 PO) Take by mouth daily.     niacin 500 MG tablet Take 250 mg by mouth 3 (three) times daily with meals.      Omega-3 Fatty Acids (OMEGA-3 FISH OIL PO) Take by  mouth daily.     simvastatin (ZOCOR) 80 MG tablet Take 80 mg by mouth daily.     VITAMIN D, CHOLECALCIFEROL, PO Take 5,000 Units by mouth as directed.      No current facility-administered medications for this visit.    Family History  Problem Relation Age of Onset   Heart disease Mother        IHSS   Heart disease Sister    Cancer Brother        Colon, Liver   Heart disease Brother    Cancer Maternal Grandmother        Colon   Heart disease Brother     Review of Systems  Constitutional: Negative.   Genitourinary: Negative.     Exam:   BP (!) 146/64 (BP Location: Right Arm, Patient Position: Sitting, Cuff Size: Large)   Pulse 60   Ht 5' 2.5" (1.588 m) Comment: reported  Wt 143 lb 9.6 oz (65.1 kg)   LMP 06/04/2004   BMI 25.85 kg/m   Height: 5' 2.5" (158.8 cm) (reported)  General appearance: alert, cooperative and appears stated age Breasts: normal appearance, no masses or tenderness Abdomen: soft, non-tender; bowel sounds normal; no masses,  no organomegaly Lymph nodes: Cervical, supraclavicular, and axillary nodes normal.  No  abnormal inguinal nodes palpated Neurologic: Grossly normal  Pelvic: External genitalia:  hypopigmentation of entire inner vulvar region from clitoris down to perineal body              Urethra:  normal appearing urethra with no masses, tenderness or lesions              Bartholins and Skenes: normal                 Vagina: shorted with radiation changes, atrophic changes and no discharge, no lesions              Cervix: absent              Pap taken: No. Bimanual Exam:  Uterus:  uterus absent              Adnexa: no mass, fullness, tenderness               Rectovaginal: Confirms               Anus:  normal sphincter tone, no lesions  Chaperone, Octaviano Batty, CMA, was present for exam.  Assessment/Plan: 1. Encntr for gyn exam (general) (routine) w abnormal findings - pap not indicated - has MMG scheduled this week - colonoscopy 2006, no  additional follow up recommended - Dr. Forde Dandy does BMD testing - vaccines reviewed/updated  2. History of endometrial cancer -s/p TAH/BSO with IIB endometrial adenocarcinoma 7/02 - did have vaginal cuff radiation  3. Age-related osteoporosis without fracture - followed by Dr. Forde Dandy.  Last one was 3 years ago and pt reports this was improved.   4. Personal history of radiation therapy  5.  Vitiligo, possible lichen sclerosus - pt is having some mild itching so will treat with mometasone ointment 0.1% topically twice weekly.  Advised to just continue using.  If itching worsening and/or does not significantly improve, advised pt to call for follow up appt.

## 2022-07-01 DIAGNOSIS — Z1231 Encounter for screening mammogram for malignant neoplasm of breast: Secondary | ICD-10-CM | POA: Diagnosis not present

## 2022-07-06 ENCOUNTER — Encounter (HOSPITAL_BASED_OUTPATIENT_CLINIC_OR_DEPARTMENT_OTHER): Payer: Self-pay | Admitting: *Deleted

## 2022-09-20 ENCOUNTER — Encounter (HOSPITAL_COMMUNITY): Payer: Self-pay

## 2022-09-20 ENCOUNTER — Emergency Department (HOSPITAL_COMMUNITY)
Admission: EM | Admit: 2022-09-20 | Discharge: 2022-09-20 | Disposition: A | Discharge status: Home / Self Care | Payer: Medicare Other | Attending: Student | Admitting: Student

## 2022-09-20 ENCOUNTER — Emergency Department (HOSPITAL_COMMUNITY): Payer: Medicare Other

## 2022-09-20 ENCOUNTER — Other Ambulatory Visit: Payer: Self-pay

## 2022-09-20 DIAGNOSIS — Z79899 Other long term (current) drug therapy: Secondary | ICD-10-CM | POA: Insufficient documentation

## 2022-09-20 DIAGNOSIS — N134 Hydroureter: Secondary | ICD-10-CM | POA: Diagnosis not present

## 2022-09-20 DIAGNOSIS — R109 Unspecified abdominal pain: Secondary | ICD-10-CM | POA: Diagnosis not present

## 2022-09-20 DIAGNOSIS — I10 Essential (primary) hypertension: Secondary | ICD-10-CM | POA: Insufficient documentation

## 2022-09-20 DIAGNOSIS — I700 Atherosclerosis of aorta: Secondary | ICD-10-CM | POA: Diagnosis not present

## 2022-09-20 DIAGNOSIS — Z7982 Long term (current) use of aspirin: Secondary | ICD-10-CM | POA: Diagnosis not present

## 2022-09-20 DIAGNOSIS — R1031 Right lower quadrant pain: Secondary | ICD-10-CM | POA: Diagnosis present

## 2022-09-20 DIAGNOSIS — N23 Unspecified renal colic: Secondary | ICD-10-CM | POA: Diagnosis not present

## 2022-09-20 LAB — CBC
HCT: 41.4 % (ref 36.0–46.0)
Hemoglobin: 14.2 g/dL (ref 12.0–15.0)
MCH: 32.7 pg (ref 26.0–34.0)
MCHC: 34.3 g/dL (ref 30.0–36.0)
MCV: 95.4 fL (ref 80.0–100.0)
Platelets: 158 10*3/uL (ref 150–400)
RBC: 4.34 MIL/uL (ref 3.87–5.11)
RDW: 12.4 % (ref 11.5–15.5)
WBC: 11.8 10*3/uL — ABNORMAL HIGH (ref 4.0–10.5)
nRBC: 0 % (ref 0.0–0.2)

## 2022-09-20 LAB — COMPREHENSIVE METABOLIC PANEL
ALT: 21 U/L (ref 0–44)
AST: 22 U/L (ref 15–41)
Albumin: 3.8 g/dL (ref 3.5–5.0)
Alkaline Phosphatase: 62 U/L (ref 38–126)
Anion gap: 8 (ref 5–15)
BUN: 12 mg/dL (ref 8–23)
CO2: 26 mmol/L (ref 22–32)
Calcium: 9.4 mg/dL (ref 8.9–10.3)
Chloride: 102 mmol/L (ref 98–111)
Creatinine, Ser: 0.82 mg/dL (ref 0.44–1.00)
GFR, Estimated: >60 mL/min (ref >60)
Glucose, Bld: 147 mg/dL — ABNORMAL HIGH (ref 70–99)
Potassium: 4.2 mmol/L (ref 3.5–5.1)
Sodium: 136 mmol/L (ref 135–145)
Total Bilirubin: 0.6 mg/dL (ref 0.3–1.2)
Total Protein: 6.6 g/dL (ref 6.5–8.1)

## 2022-09-20 LAB — URINALYSIS, ROUTINE W REFLEX MICROSCOPIC
Bacteria, UA: NONE SEEN
Bilirubin Urine: NEGATIVE
Glucose, UA: NEGATIVE mg/dL
Ketones, ur: 20 mg/dL — AB
Leukocytes,Ua: NEGATIVE
Nitrite: NEGATIVE
Protein, ur: 30 mg/dL — AB
RBC / HPF: >50 RBC/hpf — ABNORMAL HIGH (ref 0–5)
Specific Gravity, Urine: 1.018 (ref 1.005–1.030)
pH: 5 (ref 5.0–8.0)

## 2022-09-20 LAB — LIPASE, BLOOD: Lipase: 32 U/L (ref 11–51)

## 2022-09-20 MED ORDER — ONDANSETRON 4 MG PO TBDP: 4.0000 mg | ORAL_TABLET | Freq: Once | ORAL | Status: DC | PRN

## 2022-09-20 MED ORDER — HYDROCODONE-ACETAMINOPHEN 5-325 MG PO TABS: 1.0000 | ORAL_TABLET | ORAL | 0 refills | Status: AC | PRN

## 2022-09-20 MED ORDER — TAMSULOSIN HCL 0.4 MG PO CAPS: 0.4000 mg | ORAL_CAPSULE | Freq: Every day | ORAL | 0 refills | Status: AC

## 2022-09-20 MED ORDER — ONDANSETRON HCL 4 MG/2ML IJ SOLN
4.0000 mg | Freq: Once | INTRAMUSCULAR | Status: AC
  Administered 2022-09-20: 4 mg via INTRAVENOUS
  Filled 2022-09-20: qty 2

## 2022-09-20 MED ORDER — IOHEXOL 350 MG/ML SOLN
100.0000 mL | Freq: Once | INTRAVENOUS | Status: AC | PRN
  Administered 2022-09-20: 75 mL via INTRAVENOUS

## 2022-09-20 MED ORDER — SODIUM CHLORIDE 0.9 % IV BOLUS
500.0000 mL | Freq: Once | INTRAVENOUS | Status: AC
  Administered 2022-09-20: 500 mL via INTRAVENOUS

## 2022-09-20 MED ORDER — KETOROLAC TROMETHAMINE 15 MG/ML IJ SOLN
15.0000 mg | Freq: Once | INTRAMUSCULAR | Status: AC
  Administered 2022-09-20: 15 mg via INTRAVENOUS
  Filled 2022-09-20: qty 1

## 2022-09-20 MED ORDER — ONDANSETRON HCL 4 MG PO TABS: 4.0000 mg | ORAL_TABLET | Freq: Four times a day (QID) | ORAL | 0 refills | Status: AC

## 2022-09-20 NOTE — ED Triage Notes (Signed)
Pt arrived POV from home c/o right lower abdominal pain that radiates to the her right flank area. Pt states it started last night and has gotten progressively worse. Pt also endorses N/V.

## 2022-09-20 NOTE — ED Notes (Signed)
Pt to bathroom, pt reports decreased pain, states that her pain is a 5/10

## 2022-09-20 NOTE — ED Notes (Signed)
Pt back from ct

## 2022-09-20 NOTE — ED Provider Notes (Signed)
[facility] DEPARTMENT Provider Note   CSN: [id number] Arrival date & time: 09/20/22  1116     History  Chief Complaint  Patient presents with   Abdominal Pain    [name] is a 88 y.o. female.  Patient with history of hysterectomy, hypertension, high cholesterol, aortic stenosis --presents to the emergency department today for evaluation of right back and right lower quadrant abdominal pain.  She states that she began having some minor back pain around 8 PM last night.  This progressed during the course of the night and then began to move to her right lower quadrant.  This is associated with nausea and then vomiting this morning.  Pain became severe prompting emergency department visit.  No fevers, chest pain, shortness of breath.  She denies hematuria, dysuria, increased frequency or urgency.  No diarrhea or constipation.  She is not on anticoagulation.  She has not taken any over-the-counter medication for symptoms.       Home Medications Prior to Admission medications   Medication Sig Start Date End Date Taking? Authorizing Provider  amoxicillin (AMOXIL) 500 MG capsule  10/11/16   [provider]  aspirin 81 MG tablet Take 81 mg by mouth daily.    [provider]  atenolol (TENORMIN) 50 MG tablet Take 50 mg by mouth 2 (two) times daily.    [provider]  Calcium Carbonate-Vit D-Min (CALCIUM 1200 PO) Take by mouth daily.    [provider]  mometasone (ELOCON) 0.1 % ointment Apply topically twice weekly 06/27/22   Megan Salon, MD  niacin 500 MG tablet Take 250 mg by mouth 3 (three) times daily with meals.     [provider]  Omega-3 Fatty Acids (OMEGA-3 FISH OIL PO) Take by mouth daily.    [provider]  simvastatin (ZOCOR) 80 MG tablet Take 80 mg by mouth daily.    [provider]  VITAMIN D, CHOLECALCIFEROL, PO Take 5,000 Units by mouth as directed.     [provider]      Allergies    Actonel [risedronate sodium], Estrogens, Fosamax [alendronate sodium], and Gluten meal    Review of Systems   Review of Systems  Physical Exam Updated Vital Signs BP (!) 157/60 (BP Location: Right Arm)   Pulse (!) 59   Temp 98 F (36.7 C) (Oral)   Resp 16   Ht '5\' 2"'$  (1.575 m)   Wt 62.6 kg   LMP 06/04/2004   SpO2 98%   BMI 25.24 kg/m   Physical Exam Vitals and nursing note reviewed.  Constitutional:      General: She is not in acute distress.    Appearance: She is well-developed.  HENT:     Head: Normocephalic and atraumatic.     Right Ear: External ear normal.     Left Ear: External ear normal.     Nose: Nose normal.  Eyes:     Conjunctiva/sclera: Conjunctivae normal.  Cardiovascular:     Rate and Rhythm: Normal rate and regular rhythm.     Heart sounds: No murmur heard. Pulmonary:     Effort: No respiratory distress.     Breath sounds: No wheezing, rhonchi or rales.  Abdominal:     Palpations: Abdomen is soft.     Tenderness: There is abdominal tenderness in the right lower quadrant. There is no guarding or rebound.  Musculoskeletal:     Cervical back: Normal range of motion and neck supple.  Right lower leg: No edema.     Left lower leg: No edema.  Skin:    General: Skin is warm and dry.     Findings: No rash.  Neurological:     General: No focal deficit present.     Mental Status: She is alert. Mental status is at baseline.     Motor: No weakness.  Psychiatric:        Mood and Affect: Mood normal.     ED Results / Procedures / Treatments   Labs (all labs ordered are listed, but only abnormal results are displayed) Labs Reviewed  COMPREHENSIVE METABOLIC PANEL - Abnormal; Notable for the following components:      Result Value   Glucose, Bld 147 (*)    All other components within normal limits  CBC - Abnormal; Notable for the following components:   WBC 11.8 (*)    All other components within normal limits  URINALYSIS,  ROUTINE W REFLEX MICROSCOPIC - Abnormal; Notable for the following components:   APPearance HAZY (*)    Hgb urine dipstick LARGE (*)    Ketones, ur 20 (*)    Protein, ur 30 (*)    RBC / HPF >50 (*)    Non Squamous Epithelial 0-5 (*)    All other components within normal limits  LIPASE, BLOOD    EKG None  Radiology CT ABDOMEN PELVIS W CONTRAST  Result Date: 09/20/2022 CLINICAL DATA:  Acute generalized abdominal pain. EXAM: CT ABDOMEN AND PELVIS WITH CONTRAST TECHNIQUE: Multidetector CT imaging of the abdomen and pelvis was performed using the standard protocol following bolus administration of intravenous contrast. RADIATION DOSE REDUCTION: This exam was performed according to the departmental dose-optimization program which includes automated exposure control, adjustment of the mA and/or kV according to patient size and/or use of iterative reconstruction technique. CONTRAST:  80m OMNIPAQUE IOHEXOL 350 MG/ML SOLN COMPARISON:  September 29, 2006. FINDINGS: Lower chest: No acute abnormality. Hepatobiliary: No focal liver abnormality is seen. No gallstones, gallbladder wall thickening, or biliary dilatation. Pancreas: Unremarkable. No pancreatic ductal dilatation or surrounding inflammatory changes. Spleen: No significant abnormality seen. Adrenals/Urinary Tract: Adrenal glands appear normal. Moderate right hydroureteronephrosis is noted secondary to 6 mm distal right ureteral calculus. Mild ureteral wall enhancement is noted suggesting ureteritis. Urinary bladder is unremarkable. Stomach/Bowel: The stomach appears normal. There is no evidence of bowel obstruction. Sigmoid diverticulosis is noted without inflammation. Vascular/Lymphatic: Aortic atherosclerosis. No enlarged abdominal or pelvic lymph nodes. Reproductive: Status post hysterectomy. No adnexal masses. Other: No abdominal wall hernia or abnormality. No abdominopelvic ascites. Musculoskeletal: No acute or significant osseous findings.  IMPRESSION: Moderate right hydroureteronephrosis is noted secondary to 6 mm distal right ureteral calculus. Mild ureteral wall enhancement is noted suggesting ureteritis. Sigmoid diverticulosis without inflammation. Aortic Atherosclerosis (ICD10-I70.0). Electronically Signed   By: JMarijo ConceptionM.D.   On: 09/20/2022 14:40    Procedures Procedures    Medications Ordered in ED Medications  ondansetron (ZOFRAN-ODT) disintegrating tablet 4 mg (has no administration in time range)  ketorolac (TORADOL) 15 MG/ML injection 15 mg (has no administration in time range)  sodium chloride 0.9 % bolus 500 mL (has no administration in time range)  ondansetron (ZOFRAN) injection 4 mg (has no administration in time range)  iohexol (OMNIPAQUE) 350 MG/ML injection 100 mL (75 mLs Intravenous Contrast Given 09/20/22 1426)    ED Course/ Medical Decision Making/ A&P    Patient seen and examined. History obtained directly from patient. Work-up including labs, imaging, EKG ordered in  triage, if performed, were reviewed.    Labs/EKG: Independently reviewed and interpreted.  This included: CBC with minimally elevated white blood cell count 11.8 otherwise unremarkable; UA with mild hyperglycemia at 147 otherwise unremarkable; lipase 32; UA without signs of infection but does show hematuria.  Imaging: Independently visualized and interpreted.  This included: CT abdomen pelvis, agree right-sided hydronephrosis with 6 mm ureteral stone.  No other acute findings.  Medications/Fluids: Ordered: '15mg'$  Toradol, 4 mg Zofran, 500 cc fluid bolus.  Most recent vital signs reviewed and are as follows: BP (!) 157/60 (BP Location: Right Arm)   Pulse (!) 59   Temp 98 F (36.7 C) (Oral)   Resp 16   Ht '5\' 2"'$  (1.575 m)   Wt 62.6 kg   LMP 06/04/2004   SpO2 98%   BMI 25.24 kg/m   Initial impression: Right-sided ureteral colic, normal kidney function, no evidence of UTI.  Plan: Reassessment after treatment.  Hopefully  patient will be improved and able to go home once symptoms are controlled.  She appears well, nontoxic.  3:15 PM Signout out to [employer] at shift change.  Did discuss with Dr. Matilde Sprang.  And: Meds for pain control.  If improved, outpatient follow-up with urology, consider tamsulosin, pain medications for home.                            Medical Decision Making Risk Prescription drug management.   For this patient's complaint of abdominal pain, the following conditions were considered on the differential diagnosis: gastritis/PUD, enteritis/duodenitis, appendicitis, cholelithiasis/cholecystitis, cholangitis, pancreatitis, ruptured viscus, colitis, diverticulitis, small/large bowel obstruction, proctitis, cystitis, pyelonephritis, ureteral colic, aortic dissection, aortic aneurysm. In women, ectopic pregnancy, pelvic inflammatory disease, ovarian cysts, and tubo-ovarian abscess were also considered. Atypical chest etiologies were also considered including ACS, PE, and pneumonia.   CT demonstrated right-sided 6 mm distal ureteral stone likely the cause of the patient's pain.  UA without signs of infection but does show red cells consistent with ureteral stone.  No other concerning findings on CT.  Normal creatinine.  No indication for emergent urology intervention, pending pain control.         Final Clinical Impression(s) / ED Diagnoses Final diagnoses:  Ureteral colic    Rx / DC Orders ED Discharge Orders     None         Carlisle Cater, PA-C 09/20/22 [place]    Kommor, Debe Coder, MD 09/27/22 1141

## 2022-09-20 NOTE — Discharge Instructions (Addendum)
Please read and follow all provided instructions.  Your diagnoses today include:  1. Ureteral colic     Tests performed today include: Urine test that showed blood in your urine and no infection CT scan which showed a 6 millimeter kidney stone on the right side Blood test that showed normal kidney function Vital signs. See below for your results today.   Home care instructions:  Follow any educational materials contained in this packet.  Please double your fluid intake for the next several days. Strain your urine and save any stones that may pass.   BE VERY CAREFUL not to take multiple medicines containing Tylenol (also called acetaminophen). Doing so can lead to an overdose which can damage your liver and cause liver failure and possibly death.   Follow-up instructions: Please follow-up with your urologist or the urologist referral (provided on front page) in the next 1 week for further evaluation of your symptoms.  Return instructions:  Please return to the Emergency Department if you experience worsening symptoms.  Please return if you develop fever or uncontrolled pain or vomiting. Please return if you have any other emergent concerns.  Additional Information:  Your vital signs today were: BP (!) 157/60 (BP Location: Right Arm)   Pulse (!) 59   Temp 98 F (36.7 C) (Oral)   Resp 16   Ht '5\' 2"'$  (1.575 m)   Wt 62.6 kg   LMP 06/04/2004   SpO2 98%   BMI 25.24 kg/m  If your blood pressure (BP) was elevated above 135/85 this visit, please have this repeated by your doctor within one month. --------------

## 2022-09-20 NOTE — ED Provider Notes (Signed)
  Physical Exam  BP (!) 157/60 (BP Location: Right Arm)   Pulse (!) 59   Temp 98 F (36.7 C) (Oral)   Resp 16   Ht '5\' 2"'$  (1.575 m)   Wt 62.6 kg   LMP 06/04/2004   SpO2 98%   BMI 25.24 kg/m   Physical Exam Vitals and nursing note reviewed.  Constitutional:      Appearance: She is well-developed.  HENT:     Head: Normocephalic and atraumatic.  Cardiovascular:     Rate and Rhythm: Normal rate.  Pulmonary:     Effort: Pulmonary effort is normal.     Breath sounds: No wheezing or rales.  Abdominal:     General: Abdomen is flat. Bowel sounds are normal.     Palpations: Abdomen is soft.  Neurological:     Mental Status: She is alert.     Procedures  Procedures  ED Course / MDM   Clinical Course as of 09/20/22 1620  Tue Sep 20, 2022  1620 RBC / HPF(!): >50 [JS]  1620 WBC(!): 11.8 [JS]  1620 RBC / HPF(!): >50 [JS]    Clinical Course User Index [JS] Janeece Fitting, PA-C   Medical Decision Making Amount and/or Complexity of Data Reviewed Labs:  Decision-making details documented in ED Course.  Risk Prescription drug management.   Patient care assumed from [facility] PA at shift change, please see his note for full HPI.  Briefly, patient here with right back, right lower quadrant pain.  Nauseated with nausea and vomiting.  CT shows a 6 mm distal utero kidney stone.  Does not have any prior history of stones in the past.  Patient received Toradol for pain control, awaiting Control.  Plan is for patient to go home with strainer, pain medication, Flomax.  4:30 PM patient reevaluated by me, does report resolution in her symptoms.  I discussed the plan and treatment at this time.  She will go home with a strainer, Vicodin, Flomax, Zofran for symptomatic treatment.  I did speak to her about following up with [facility] in the next couple of days if pain persist.  Vitals are stable, husband at the bedside does have a prior history of multiple kidney stones therefore he is  also aware of return precautions, patient is stable for disposition.    Portions of this note were generated with [employer]. Dictation errors may occur despite best attempts at proofreading.        Janeece Fitting, PA-C 09/20/22 1631    Gareth Morgan, MD 09/21/22 1113

## 2022-09-20 NOTE — ED Provider Triage Note (Signed)
Emergency Medicine Provider Triage Evaluation Note  [name] , a 88 y.o. female  was evaluated in triage.  Pt complains of abdominal pain starting last night. Started in right lower back and now radiates to the right lower abdomen. Started vomiting this morning. Several episodes of diarrhea the other day, no blood. Hx of hysterectomy, no other abd surgeries.   Review of Systems  Positive: Abd pain, diarrhea, vomiting, chills Negative: Urinary symptoms, fever, chest pain  Physical Exam  BP (!) 164/80   Pulse (!) 58   Temp 97.7 F (36.5 C) (Oral)   Resp (!) 22   Ht '5\' 2"'$  (1.575 m)   Wt 62.6 kg   LMP 06/04/2004   SpO2 98%   BMI 25.24 kg/m  Gen:   Awake, no distress   Resp:  Normal effort  MSK:   Moves extremities without difficulty  Other:  Appears very uncomfortable  Medical Decision Making  Medically screening exam initiated at 12:24 PM.  Appropriate orders placed.  [name] was informed that the remainder of the evaluation will be completed by another provider, this initial triage assessment does not replace that evaluation, and the importance of remaining in the ED until their evaluation is complete.  Workup initiated including CT scan. Zofran given in triage   Kateri Plummer, PA-C 09/20/22 1227

## 2022-09-30 DIAGNOSIS — N201 Calculus of ureter: Secondary | ICD-10-CM | POA: Diagnosis not present

## 2022-10-07 DIAGNOSIS — N201 Calculus of ureter: Secondary | ICD-10-CM | POA: Diagnosis not present

## 2022-11-02 DIAGNOSIS — I700 Atherosclerosis of aorta: Secondary | ICD-10-CM | POA: Diagnosis not present

## 2022-11-02 DIAGNOSIS — E559 Vitamin D deficiency, unspecified: Secondary | ICD-10-CM | POA: Diagnosis not present

## 2022-11-02 DIAGNOSIS — R7302 Impaired glucose tolerance (oral): Secondary | ICD-10-CM | POA: Diagnosis not present

## 2022-11-02 DIAGNOSIS — E785 Hyperlipidemia, unspecified: Secondary | ICD-10-CM | POA: Diagnosis not present

## 2022-11-07 DIAGNOSIS — K769 Liver disease, unspecified: Secondary | ICD-10-CM | POA: Diagnosis not present

## 2022-11-07 DIAGNOSIS — N201 Calculus of ureter: Secondary | ICD-10-CM | POA: Diagnosis not present

## 2022-11-07 DIAGNOSIS — K5730 Diverticulosis of large intestine without perforation or abscess without bleeding: Secondary | ICD-10-CM | POA: Diagnosis not present

## 2022-11-07 DIAGNOSIS — N132 Hydronephrosis with renal and ureteral calculous obstruction: Secondary | ICD-10-CM | POA: Diagnosis not present

## 2022-11-07 DIAGNOSIS — K449 Diaphragmatic hernia without obstruction or gangrene: Secondary | ICD-10-CM | POA: Diagnosis not present

## 2022-11-09 DIAGNOSIS — M810 Age-related osteoporosis without current pathological fracture: Secondary | ICD-10-CM | POA: Diagnosis not present

## 2022-11-09 DIAGNOSIS — Z0000 Encounter for general adult medical examination without abnormal findings: Secondary | ICD-10-CM | POA: Diagnosis not present

## 2022-11-09 DIAGNOSIS — R7302 Impaired glucose tolerance (oral): Secondary | ICD-10-CM | POA: Diagnosis not present

## 2022-11-09 DIAGNOSIS — I421 Obstructive hypertrophic cardiomyopathy: Secondary | ICD-10-CM | POA: Diagnosis not present

## 2022-11-09 DIAGNOSIS — R82998 Other abnormal findings in urine: Secondary | ICD-10-CM | POA: Diagnosis not present

## 2022-11-14 DIAGNOSIS — H524 Presbyopia: Secondary | ICD-10-CM | POA: Diagnosis not present

## 2023-01-30 DIAGNOSIS — N201 Calculus of ureter: Secondary | ICD-10-CM | POA: Diagnosis not present

## 2023-02-28 DIAGNOSIS — K080 Exfoliation of teeth due to systemic causes: Secondary | ICD-10-CM | POA: Diagnosis not present

## 2023-05-10 DIAGNOSIS — R7302 Impaired glucose tolerance (oral): Secondary | ICD-10-CM | POA: Diagnosis not present

## 2023-08-01 DIAGNOSIS — Z1231 Encounter for screening mammogram for malignant neoplasm of breast: Secondary | ICD-10-CM | POA: Diagnosis not present

## 2023-10-09 DIAGNOSIS — K080 Exfoliation of teeth due to systemic causes: Secondary | ICD-10-CM | POA: Diagnosis not present

## 2023-10-10 DIAGNOSIS — N201 Calculus of ureter: Secondary | ICD-10-CM | POA: Diagnosis not present

## 2023-11-10 DIAGNOSIS — E559 Vitamin D deficiency, unspecified: Secondary | ICD-10-CM | POA: Diagnosis not present

## 2023-11-10 DIAGNOSIS — R7302 Impaired glucose tolerance (oral): Secondary | ICD-10-CM | POA: Diagnosis not present

## 2023-11-10 DIAGNOSIS — E785 Hyperlipidemia, unspecified: Secondary | ICD-10-CM | POA: Diagnosis not present

## 2023-11-17 DIAGNOSIS — Z1331 Encounter for screening for depression: Secondary | ICD-10-CM | POA: Diagnosis not present

## 2023-11-17 DIAGNOSIS — Z0000 Encounter for general adult medical examination without abnormal findings: Secondary | ICD-10-CM | POA: Diagnosis not present

## 2023-11-17 DIAGNOSIS — I421 Obstructive hypertrophic cardiomyopathy: Secondary | ICD-10-CM | POA: Diagnosis not present

## 2023-11-17 DIAGNOSIS — R82998 Other abnormal findings in urine: Secondary | ICD-10-CM | POA: Diagnosis not present

## 2023-11-17 DIAGNOSIS — Z1339 Encounter for screening examination for other mental health and behavioral disorders: Secondary | ICD-10-CM | POA: Diagnosis not present

## 2023-11-22 DIAGNOSIS — H524 Presbyopia: Secondary | ICD-10-CM | POA: Diagnosis not present

## 2023-11-22 DIAGNOSIS — H52203 Unspecified astigmatism, bilateral: Secondary | ICD-10-CM | POA: Diagnosis not present

## 2023-12-04 DIAGNOSIS — H524 Presbyopia: Secondary | ICD-10-CM | POA: Diagnosis not present

## 2024-05-13 DIAGNOSIS — K080 Exfoliation of teeth due to systemic causes: Secondary | ICD-10-CM | POA: Diagnosis not present

## 2024-05-21 DIAGNOSIS — M810 Age-related osteoporosis without current pathological fracture: Secondary | ICD-10-CM | POA: Diagnosis not present

## 2024-05-21 DIAGNOSIS — R7302 Impaired glucose tolerance (oral): Secondary | ICD-10-CM | POA: Diagnosis not present

## 2024-07-01 ENCOUNTER — Ambulatory Visit (INDEPENDENT_AMBULATORY_CARE_PROVIDER_SITE_OTHER): Payer: Medicare Other | Admitting: Obstetrics & Gynecology

## 2024-07-01 ENCOUNTER — Encounter (HOSPITAL_BASED_OUTPATIENT_CLINIC_OR_DEPARTMENT_OTHER): Payer: Self-pay | Admitting: Obstetrics & Gynecology

## 2024-07-01 VITALS — BP 139/66 | HR 68 | Wt 141.8 lb

## 2024-07-01 DIAGNOSIS — Z8542 Personal history of malignant neoplasm of other parts of uterus: Secondary | ICD-10-CM

## 2024-07-01 DIAGNOSIS — Z90710 Acquired absence of both cervix and uterus: Secondary | ICD-10-CM | POA: Diagnosis not present

## 2024-07-01 DIAGNOSIS — M8580 Other specified disorders of bone density and structure, unspecified site: Secondary | ICD-10-CM

## 2024-07-01 DIAGNOSIS — Z01419 Encounter for gynecological examination (general) (routine) without abnormal findings: Secondary | ICD-10-CM

## 2024-07-01 NOTE — Progress Notes (Signed)
   Breast and pelvic exam Patient name: Windsor E Nau MRN [id number]  Date of birth: 02-28-1932 Chief Complaint:   Gynecologic Exam  History of Present Illness:   Addilynne E Beaston is a 89 y.o. G4P3 Caucasian female being seen today for breast and pelvic exam.  H/O endometrial cancer.  Denies vaginal bleeding or vaginal discharge.     Patient's last menstrual period was 06/04/2004.  Last pap 2020.  Results were: negative.  Last mammogram: 06/2022. Results were: normal. Pt reports she did one in 2024.  Scheduled for 08/13/2024 for next mammogram Last colonoscopy: 2006 DEXA:  followed by Dr. Nichole      07/01/2024    2:36 PM 06/27/2022    2:41 PM  Depression screen PHQ 2/9  Decreased Interest 0 0  Down, Depressed, Hopeless 0 0  PHQ - 2 Score 0 0     Review of Systems:   Pertinent items are noted in HPI Denies any urinary or bowel changes.  Denies pelvic pain   Pertinent History Reviewed:  Reviewed past medical,surgical, social and family history.  Reviewed problem list, medications and allergies. Physical Assessment:   Vitals:   07/01/24 1415  BP: 139/66  Pulse: 68  SpO2: 97%  Weight: 141 lb 12.8 oz (64.3 kg)  Body mass index is 25.94 kg/m.        Physical Examination:   General appearance - well appearing, and in no distress  Mental status - alert, oriented to person, place, and time  Psych:  She has a normal mood and affect  Skin - warm and dry, normal color, no suspicious lesions noted  Chest - effort normal, all lung fields clear to auscultation bilaterally  Heart - normal rate and regular rhythm  Neck:  midline trachea, no thyromegaly or nodules  Breasts - breasts appear normal, no suspicious masses, no skin or nipple changes or  axillary nodes  Abdomen - soft, nontender, nondistended, no masses or organomegaly  Pelvic - VULVA: normal appearing vulva with no masses, tenderness or lesions   VAGINA: atrophic tissue, no lesions, cuff well healed   CERVIX:  surgically absent  Thin prep pap is not done.  UTERUS: uterus is felt to be normal size, shape, consistency and nontender   ADNEXA: No adnexal masses or tenderness noted.  Rectal - normal rectal, good sphincter tone, no masses felt.  Extremities:  No swelling or varicosities noted  Chaperone present for exam  No results found for this or any previous visit (from the past 24 hours).  Assessment & Plan:  1. Encntr for gyn exam (general) (routine) w/o abn findings (Primary) - Pap smear not indicated - Mammogram scheduled in September - Colonoscopy 2006.  No updated indicated.  - Bone mineral density done with Dr. Nichole - lab work done with PCP, Dr. Nichole - vaccines reviewed/updated  2. History of endometrial cancer - s/p TAH/BSO 2005 with Stage II B, grade 1 adenocarcinoma of endometrium  3. Osteopenia, unspecified location - taking calcium 500mg  daily and Vit D 5000I.U. twice weekly  4. H/O: hysterectomy   No orders of the defined types were placed in this encounter.   Meds: No orders of the defined types were placed in this encounter.   Follow-up: 1-2 year or follow for new issues/concerns  Ronal GORMAN Pinal, MD 07/01/2024 2:49 PM

## 2024-08-06 ENCOUNTER — Encounter (HOSPITAL_BASED_OUTPATIENT_CLINIC_OR_DEPARTMENT_OTHER): Payer: Self-pay | Admitting: Obstetrics & Gynecology

## 2024-08-06 DIAGNOSIS — Z1231 Encounter for screening mammogram for malignant neoplasm of breast: Secondary | ICD-10-CM | POA: Diagnosis not present

## 2024-08-11 ENCOUNTER — Emergency Department (HOSPITAL_BASED_OUTPATIENT_CLINIC_OR_DEPARTMENT_OTHER)

## 2024-08-11 ENCOUNTER — Emergency Department (HOSPITAL_BASED_OUTPATIENT_CLINIC_OR_DEPARTMENT_OTHER)
Admission: EM | Admit: 2024-08-11 | Discharge: 2024-08-11 | Disposition: A | Discharge status: Home / Self Care | Attending: Emergency Medicine | Admitting: Emergency Medicine

## 2024-08-11 DIAGNOSIS — R0602 Shortness of breath: Secondary | ICD-10-CM | POA: Diagnosis not present

## 2024-08-11 DIAGNOSIS — I10 Essential (primary) hypertension: Secondary | ICD-10-CM | POA: Insufficient documentation

## 2024-08-11 DIAGNOSIS — Z7982 Long term (current) use of aspirin: Secondary | ICD-10-CM | POA: Insufficient documentation

## 2024-08-11 DIAGNOSIS — Z8542 Personal history of malignant neoplasm of other parts of uterus: Secondary | ICD-10-CM | POA: Insufficient documentation

## 2024-08-11 DIAGNOSIS — Z79899 Other long term (current) drug therapy: Secondary | ICD-10-CM | POA: Insufficient documentation

## 2024-08-11 DIAGNOSIS — R000 Tachycardia, unspecified: Secondary | ICD-10-CM | POA: Diagnosis not present

## 2024-08-11 DIAGNOSIS — I4891 Unspecified atrial fibrillation: Secondary | ICD-10-CM | POA: Diagnosis not present

## 2024-08-11 LAB — CBC WITH DIFFERENTIAL/PLATELET
Abs Immature Granulocytes: 0.02 K/uL (ref 0.00–0.07)
Basophils Absolute: 0 K/uL (ref 0.0–0.1)
Basophils Relative: 0 %
Eosinophils Absolute: 0.2 K/uL (ref 0.0–0.5)
Eosinophils Relative: 3 %
HCT: 38.5 % (ref 36.0–46.0)
Hemoglobin: 13.1 g/dL (ref 12.0–15.0)
Immature Granulocytes: 0 %
Lymphocytes Relative: 28 %
Lymphs Abs: 2.1 K/uL (ref 0.7–4.0)
MCH: 32.4 pg (ref 26.0–34.0)
MCHC: 34 g/dL (ref 30.0–36.0)
MCV: 95.3 fL (ref 80.0–100.0)
Monocytes Absolute: 0.6 K/uL (ref 0.1–1.0)
Monocytes Relative: 8 %
Neutro Abs: 4.5 K/uL (ref 1.7–7.7)
Neutrophils Relative %: 61 %
Platelets: 149 K/uL — ABNORMAL LOW (ref 150–400)
RBC: 4.04 MIL/uL (ref 3.87–5.11)
RDW: 12.7 % (ref 11.5–15.5)
WBC: 7.5 K/uL (ref 4.0–10.5)
nRBC: 0 % (ref 0.0–0.2)

## 2024-08-11 LAB — COMPREHENSIVE METABOLIC PANEL WITH GFR
ALT: 22 U/L (ref 0–44)
AST: 28 U/L (ref 15–41)
Albumin: 3.9 g/dL (ref 3.5–5.0)
Alkaline Phosphatase: 66 U/L (ref 38–126)
Anion gap: 11 (ref 5–15)
BUN: 17 mg/dL (ref 8–23)
CO2: 22 mmol/L (ref 22–32)
Calcium: 9.5 mg/dL (ref 8.9–10.3)
Chloride: 101 mmol/L (ref 98–111)
Creatinine, Ser: 0.85 mg/dL (ref 0.44–1.00)
GFR, Estimated: >60 mL/min (ref >60)
Glucose, Bld: 101 mg/dL — ABNORMAL HIGH (ref 70–99)
Potassium: 4.1 mmol/L (ref 3.5–5.1)
Sodium: 134 mmol/L — ABNORMAL LOW (ref 135–145)
Total Bilirubin: 0.4 mg/dL (ref 0.0–1.2)
Total Protein: 6.5 g/dL (ref 6.5–8.1)

## 2024-08-11 LAB — TROPONIN T, HIGH SENSITIVITY
Troponin T High Sensitivity: 42 ng/L — ABNORMAL HIGH (ref 0–19)
Troponin T High Sensitivity: 42 ng/L — ABNORMAL HIGH (ref 0–19)

## 2024-08-11 LAB — TSH: TSH: 1.79 u[IU]/mL (ref 0.350–4.500)

## 2024-08-11 LAB — MAGNESIUM: Magnesium: 1.8 mg/dL (ref 1.7–2.4)

## 2024-08-11 MED ORDER — SODIUM CHLORIDE 0.9 % IV BOLUS
1000.0000 mL | Freq: Once | INTRAVENOUS | Status: AC
  Administered 2024-08-11: 1000 mL via INTRAVENOUS

## 2024-08-11 MED ORDER — MAGNESIUM SULFATE 2 GM/50ML IV SOLN
2.0000 g | Freq: Once | INTRAVENOUS | Status: AC
  Administered 2024-08-11: 2 g via INTRAVENOUS
  Filled 2024-08-11: qty 50

## 2024-08-11 NOTE — ED Provider Notes (Signed)
 [place] EMERGENCY DEPARTMENT AT [facility] Provider Note   CSN: [id number] Arrival date & time: 08/11/24  1410     Patient presents with: Tachycardia   [name] is a 89 y.o. female.  With a history of hypertension hyperlipidemia and endometrial cancer presents to the ED for irregular heart rate.  Patient reports feeling well in her normal state of health yesterday.  She was with her family and enjoyed a couple cocktails which is not unusual for her.  Early yesterday evening she began to feel sensation of a racing heart rate and palpitations.  Her Apple watch reported heart rate in the 130s 140s and atrial fibrillation.  This continued today.  She called her PCP office who instructed her to take another dose of atenolol.  This effectively lowered her heart rate but also lowered her blood pressure.  No prior history of atrial fibrillation.  No anticoagulation.   HPI     Prior to Admission medications   Medication Sig Start Date End Date Taking? Authorizing Provider  amoxicillin (AMOXIL) 500 MG capsule  10/11/16   [provider]  aspirin 81 MG tablet Take 81 mg by mouth daily.    [provider]  atenolol (TENORMIN) 50 MG tablet Take 50 mg by mouth 2 (two) times daily.    [provider]  Calcium Carbonate-Vit D-Min (CALCIUM 1200 PO) Take by mouth daily.    [provider]  mometasone  (ELOCON ) 0.1 % ointment Apply topically twice weekly 06/27/22   Cleotilde Ronal RAMAN, MD  niacin 500 MG tablet Take 250 mg by mouth 3 (three) times daily with meals.     [provider]  Omega-3 Fatty Acids (OMEGA-3 FISH OIL PO) Take by mouth daily.    [provider]  ondansetron  (ZOFRAN ) 4 MG tablet Take 1 tablet (4 mg total) by mouth every 6 (six) hours. 09/20/22   Soto, Johana, PA-C  simvastatin (ZOCOR) 80 MG tablet Take 80 mg by mouth daily.    [provider]  VITAMIN D, CHOLECALCIFEROL, PO Take 5,000 Units by mouth as directed.      [provider]    Allergies: Actonel [risedronate sodium], Estrogens, Fosamax [alendronate sodium], and Gluten meal    Review of Systems  Updated Vital Signs BP (!) 102/56   Pulse 60   Temp (!) 97.1 F (36.2 C) (Temporal)   Resp 19   LMP 06/04/2004   SpO2 94%   Physical Exam Vitals and nursing note reviewed.  HENT:     Head: Normocephalic and atraumatic.  Eyes:     Pupils: Pupils are equal, round, and reactive to light.  Cardiovascular:     Rate and Rhythm: Normal rate. Rhythm irregular.  Pulmonary:     Effort: Pulmonary effort is normal.     Breath sounds: Normal breath sounds.  Abdominal:     Palpations: Abdomen is soft.     Tenderness: There is no abdominal tenderness.  Skin:    General: Skin is warm and dry.  Neurological:     Mental Status: She is alert.  Psychiatric:        Mood and Affect: Mood normal.     (all labs ordered are listed, but only abnormal results are displayed) Labs Reviewed  COMPREHENSIVE METABOLIC PANEL WITH GFR - Abnormal; Notable for the following components:      Result Value   Sodium 134 (*)    Glucose, Bld 101 (*)    All other components within normal limits  CBC WITH DIFFERENTIAL/PLATELET - Abnormal; Notable for the following components:   Platelets 149 (*)    All other components within normal limits  TROPONIN T, HIGH SENSITIVITY - Abnormal; Notable for the following components:   Troponin T High Sensitivity 42 (*)    All other components within normal limits  TROPONIN T, HIGH SENSITIVITY - Abnormal; Notable for the following components:   Troponin T High Sensitivity 42 (*)    All other components within normal limits  MAGNESIUM   TSH    EKG: EKG Interpretation Date/Time:  Sunday August 11 2024 16:49:05 EDT Ventricular Rate:  59 PR Interval:  179 QRS Duration:  101 QT Interval:  475 QTC Calculation: 471 R Axis:   -44  Text Interpretation: Sinus rhythm Left anterior fascicular block Abnormal R-wave  progression, late transition Borderline repol abnrm, anterolateral leads Confirmed by Pamella Sharper [phone]) on 08/11/2024 7:51:13 PM  Radiology: DG Chest Portable 1 View Result Date: 08/11/2024 CLINICAL DATA:  Tachycardia and shortness of breath EXAM: PORTABLE CHEST 1 VIEW COMPARISON:  Chest radiograph dated 05/23/2011 FINDINGS: Normal lung volumes. No focal consolidations. No pleural effusion or pneumothorax. Enlarged cardiomediastinal silhouette. No acute osseous abnormality. IMPRESSION: Enlarged cardiomediastinal silhouette. No focal consolidations. Electronically Signed   By: Limin  Xu M.D.   On: 08/11/2024 15:48     Procedures   Medications Ordered in the ED  sodium chloride  0.9 % bolus 1,000 mL (0 mLs Intravenous Stopped 08/11/24 1724)  magnesium  sulfate IVPB 2 g 50 mL (0 g Intravenous Stopped 08/11/24 1902)    Clinical Course as of 08/11/24 1953  Sun Aug 11, 2024  1951 Patient converted back to normal sinus rhythm shortly after IV fluid bolus.  Magnesium  repleted.  Delta troponin flat.  Remains stable and normal sinus rhythm at this time.  Her CHA2DS2-VASc score is 3 which would prompt initiation of anticoagulation.  I discussed this with the patient and her son.  Considering that this is her first episode of A-fib and she is back in normal sinus rhythm now she wants to hold off on a new prescription and discussed with her primary doctor tomorrow morning.  She understands return precautions which she should come back to the ED for [MP]    Clinical Course User Index [MP] Pamella Sharper LABOR, DO                                 Medical Decision Making 89 year old female with history as above presents to the ED for tachycardia palpitations.  Took an extra dose of atenolol this morning prior to arrival.  It appears as though she achieved adequate rate control of her A-fib with this extra dose.  Still in A-fib rate controlled.  Pressure is a little soft most likely due to the atenolol here.  As far  as underlying triggers no recent infectious symptoms has been feeling well.  She did have alcohol  last night but this is not unusual for her.  Could consider dehydration as a potential trigger.  Will send TSH and electrolyte panel as well.  Will see what her heart rate does after some IV fluids here and laboratory workup.  Considering she is pretty sure that A-fib started last night could consider cardioversion if she remains in A-fib here  Amount and/or Complexity of Data Reviewed Labs: ordered. Radiology: ordered.  Risk Prescription drug management.        Final diagnoses:  Atrial fibrillation, unspecified type (  [facility])    ED Discharge Orders     None          Pamella Ozell LABOR, DO 08/11/24 1953

## 2024-08-11 NOTE — ED Triage Notes (Signed)
 Pt c/o rapid heart beat that began this morning upon waking.  Pt reports 120-140bpm according to her watch.  Pt states some mild light-headedness and occasional tightness, but denies any feelings of discomfort at present.  Pt called PMD and advised to followup in ED after HR stayed elevated for >2 hours.

## 2024-08-11 NOTE — Discharge Instructions (Signed)
 You were seen in the emergency department for palpitations Your EKG did show atrial fibrillation The atrial fibrillation resolved after fluids This may be caused by dehydration or alcohol  use Rest of your blood work looked okay We discussed starting a medication to thin your blood but you will need to wait and discuss this with your primary care doctor Please call your primary care doctor tomorrow to discuss today's visit and need for a new prescription such as Eliquis Return to the emergency room for recurrent palpitations, chest pain or trouble breathing Continue taking all previous prescribed indications as directed

## 2024-08-11 NOTE — ED Triage Notes (Deleted)
 C/o body aches, chills, and congestion x 2 days. States been outside a lot lately.   States took and old covid test and was positive.

## 2024-08-11 NOTE — ED Notes (Signed)
 DC paperwork given and verbally understood.

## 2024-08-15 DIAGNOSIS — Z23 Encounter for immunization: Secondary | ICD-10-CM | POA: Diagnosis not present

## 2024-08-15 DIAGNOSIS — I480 Paroxysmal atrial fibrillation: Secondary | ICD-10-CM | POA: Diagnosis not present

## 2024-09-03 ENCOUNTER — Encounter (HOSPITAL_COMMUNITY): Payer: Self-pay | Admitting: Internal Medicine

## 2024-09-03 ENCOUNTER — Ambulatory Visit (HOSPITAL_COMMUNITY)
Admission: RE | Admit: 2024-09-03 | Discharge: 2024-09-03 | Disposition: A | Discharge status: Home / Self Care | Source: Ambulatory Visit | Attending: Internal Medicine | Admitting: Internal Medicine

## 2024-09-03 VITALS — BP 148/70 | HR 59 | Ht 62.0 in | Wt 141.2 lb

## 2024-09-03 DIAGNOSIS — D6869 Other thrombophilia: Secondary | ICD-10-CM

## 2024-09-03 DIAGNOSIS — I480 Paroxysmal atrial fibrillation: Secondary | ICD-10-CM | POA: Diagnosis not present

## 2024-09-03 DIAGNOSIS — I4891 Unspecified atrial fibrillation: Secondary | ICD-10-CM | POA: Diagnosis not present

## 2024-09-03 MED ORDER — APIXABAN 5 MG PO TABS: 5.0000 mg | ORAL_TABLET | Freq: Two times a day (BID) | ORAL | 6 refills | Status: AC

## 2024-09-03 NOTE — Progress Notes (Signed)
 Primary Care Physician: Nichole Senior, MD Primary Cardiologist: None Electrophysiologist: None     Referring Physician: ED     [name] is a 89 y.o. female with a history of HLD, endometrial cancer, and atrial fibrillation who presents for consultation in the [facility]. ED visit on 9/7 for palpitations found to be in new Afib. Patient converted to SR after IV fluid bolus; declined anticoagulation wished to speak with PCP. Patient has a CHADS2VASC score of at least 3.  On evaluation today, patient is currently in NSR. She notes no further episodes of Afib since ED visit. She wears an Apple watch to monitor rhythm. They went to [facility] last week and patient able to enjoy daily activity without issue. She is taking Eliquis 2.5 mg as given by PCP. She has daily small amount of wine intake.   Today, she denies symptoms of palpitations, chest pain, shortness of breath, orthopnea, PND, lower extremity edema, dizziness, presyncope, syncope, bleeding, or neurologic sequela. The patient is tolerating medications without difficulties and is otherwise without complaint today.     she has a BMI of Body mass index is 25.83 kg/m.SABRA Filed Weights   09/03/24 1039  Weight: 64 kg    Current Outpatient Medications  Medication Sig Dispense Refill   amoxicillin (AMOXIL) 500 MG capsule   1   apixaban (ELIQUIS) 5 MG TABS tablet Take 1 tablet (5 mg total) by mouth 2 (two) times daily. 60 tablet 6   atenolol (TENORMIN) 50 MG tablet Take 50 mg by mouth 2 (two) times daily.     Calcium Carbonate-Vit D-Min (CALCIUM 1200 PO) Take by mouth daily.     mometasone  (ELOCON ) 0.1 % ointment Apply topically twice weekly 45 g 3   niacin 500 MG tablet Take 250 mg by mouth 3 (three) times daily with meals.      Omega-3 Fatty Acids (OMEGA-3 FISH OIL PO) Take by mouth daily.     ondansetron  (ZOFRAN ) 4 MG tablet Take 1 tablet (4 mg total) by mouth every 6 (six)  hours. 12 tablet 0   simvastatin (ZOCOR) 80 MG tablet Take 80 mg by mouth daily. (Patient taking differently: Take 40 mg by mouth daily.)     VITAMIN D, CHOLECALCIFEROL, PO Take 5,000 Units by mouth as directed.      No current facility-administered medications for this encounter.    Atrial Fibrillation Management history:  Previous antiarrhythmic drugs: none Previous cardioversions: none Previous ablations: none Anticoagulation history: none   ROS- All systems are reviewed and negative except as per the HPI above.  Physical Exam: BP (!) 148/70   Pulse (!) 59   Ht 5' 2 (1.575 m)   Wt 64 kg   LMP 06/04/2004   BMI 25.83 kg/m   GEN: Well nourished, well developed in no acute distress NECK: No JVD; No carotid bruits CARDIAC: Regular rate and rhythm, no murmurs, rubs, gallops RESPIRATORY:  Clear to auscultation without rales, wheezing or rhonchi  ABDOMEN: Soft, non-tender, non-distended EXTREMITIES:  No edema; No deformity   EKG today demonstrates  Vent. rate 59 BPM PR interval 196 ms QRS duration 88 ms QT/QTcB 438/433 ms P-R-T axes 77 -55 81 Sinus bradycardia Left anterior fascicular block ST & T wave abnormality, consider lateral ischemia Abnormal ECG When compared with ECG of 11-Aug-2024 16:49, PREVIOUS ECG IS PRESENT  Echo 03/24/2017 demonstrated normal LV function  ASSESSMENT & PLAN CHA2DS2-VASc Score = 3  The patient's score is based  upon: CHF History: 0 HTN History: 0 Diabetes History: 0 Stroke History: 0 Vascular Disease History: 0 Age Score: 2 Gender Score: 1       ASSESSMENT AND PLAN: Paroxysmal Atrial Fibrillation (ICD10:  I48.0) The patient's CHA2DS2-VASc score is 3, indicating a 3.2% annual risk of stroke.    Patient is currently in NSR. Education provided about Afib. Discussion about triggers for Afib and medication treatments going forward if indicated. After discussion, we will proceed with conservative observation at this time. Continue to  use rhythm monitoring device. Continue atenolol 50 mg BID. Will order updated echocardiogram.    Secondary Hypercoagulable State (ICD10:  D68.69) The patient is at significant risk for stroke/thromboembolism based upon her CHA2DS2-VASc Score of 3.  Continue Apixaban (Eliquis).  We discussed based on patient's age, weight, and creatinine function she should be on Eliquis 5 mg BID. We discussed the reasoning behind anticoagulation in the setting of stroke prevention related to Afib. We discussed the benefits vs risks of anticoagulation. After discussion, patient would like to continue anticoagulation and understands the potential risks. Will transition to Eliquis 5 mg BID and print order for patient to have CBC in 1 month.   Follow up 6 months Afib clinic.    Terra Pac, [facility]  Afib Clinic [address] [place], [place] [address] [phone]

## 2024-09-03 NOTE — Patient Instructions (Signed)
 Increase Eliquis 5mg  twice a day  Echocardiogram -- scheduling will call once insurance authorization received.  Go get lab work any [facility] in one month 10/03/24  You may go to any [facility] Location for your lab work:  [facility] - [address] ([facility]) - [address] [phone] [address]  [place] - [address]   [place]  - [address]   [place] - [address] - [address] [employer]  - [address] [place] - [address] ([facility]    If you have any lab test that is abnormal or we need to change your treatment, we will call you

## 2024-09-05 ENCOUNTER — Other Ambulatory Visit (HOSPITAL_COMMUNITY): Payer: Self-pay | Admitting: Endocrinology

## 2024-09-05 DIAGNOSIS — I480 Paroxysmal atrial fibrillation: Secondary | ICD-10-CM

## 2024-10-02 ENCOUNTER — Ambulatory Visit (HOSPITAL_COMMUNITY)
Admission: RE | Admit: 2024-10-02 | Discharge: 2024-10-02 | Disposition: A | Discharge status: Home / Self Care | Source: Ambulatory Visit | Attending: Cardiology | Admitting: Cardiology

## 2024-10-02 DIAGNOSIS — I480 Paroxysmal atrial fibrillation: Secondary | ICD-10-CM | POA: Insufficient documentation

## 2024-10-02 DIAGNOSIS — I4891 Unspecified atrial fibrillation: Secondary | ICD-10-CM | POA: Diagnosis not present

## 2024-10-02 LAB — ECHOCARDIOGRAM COMPLETE
AR max vel: 2.14 cm2
AV Area VTI: 2.31 cm2
AV Area mean vel: 2.13 cm2
AV Mean grad: 2.5 mmHg
AV Peak grad: 4.8 mmHg
Ao pk vel: 1.09 m/s
Area-P 1/2: 5.52 cm2
MV M vel: 5.65 m/s
MV Peak grad: 127.5 mmHg
S' Lateral: 2.5 cm

## 2024-10-03 ENCOUNTER — Ambulatory Visit (HOSPITAL_COMMUNITY): Payer: Self-pay | Admitting: Internal Medicine

## 2024-10-03 LAB — CBC
Hematocrit: 41.1 % (ref 34.0–46.6)
Hemoglobin: 13.5 g/dL (ref 11.1–15.9)
MCH: 32.3 pg (ref 26.6–33.0)
MCHC: 32.8 g/dL (ref 31.5–35.7)
MCV: 98 fL — ABNORMAL HIGH (ref 79–97)
Platelets: 151 x10E3/uL (ref 150–450)
RBC: 4.18 x10E6/uL (ref 3.77–5.28)
RDW: 12.6 % (ref 11.7–15.4)
WBC: 7.4 x10E3/uL (ref 3.4–10.8)

## 2024-10-08 DIAGNOSIS — N200 Calculus of kidney: Secondary | ICD-10-CM | POA: Diagnosis not present

## 2024-11-19 DIAGNOSIS — R82998 Other abnormal findings in urine: Secondary | ICD-10-CM | POA: Diagnosis not present

## 2025-02-28 ENCOUNTER — Ambulatory Visit (HOSPITAL_COMMUNITY): Admitting: Internal Medicine

## 2025-03-03 ENCOUNTER — Ambulatory Visit (HOSPITAL_COMMUNITY): Admitting: Internal Medicine
